# Patient Record
Sex: Female | Born: 1995 | Race: White | Hispanic: Yes | Marital: Married | Smoking: Never smoker
Health system: Southern US, Community
[De-identification: ages and names within clinical notes are randomized; demographics above are authoritative.]

## PROBLEM LIST (undated history)

## (undated) ENCOUNTER — Emergency Department (HOSPITAL_COMMUNITY): Discharge status: Absent without leave

## (undated) DIAGNOSIS — N946 Dysmenorrhea, unspecified: Secondary | ICD-10-CM

## (undated) DIAGNOSIS — F419 Anxiety disorder, unspecified: Secondary | ICD-10-CM

## (undated) DIAGNOSIS — G43109 Migraine with aura, not intractable, without status migrainosus: Secondary | ICD-10-CM

## (undated) HISTORY — DX: Dysmenorrhea, unspecified: N94.6

## (undated) HISTORY — DX: Anxiety disorder, unspecified: F41.9

## (undated) HISTORY — PX: HERNIA REPAIR: SHX51

## (undated) HISTORY — DX: Migraine with aura, not intractable, without status migrainosus: G43.109

---
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
## 2014-11-13 ENCOUNTER — Emergency Department (HOSPITAL_COMMUNITY)

## 2014-11-13 ENCOUNTER — Encounter (HOSPITAL_COMMUNITY): Payer: Self-pay | Admitting: Emergency Medicine

## 2014-11-13 ENCOUNTER — Emergency Department (HOSPITAL_COMMUNITY)
Admission: EM | Admit: 2014-11-13 | Discharge: 2014-11-13 | Disposition: A | Discharge status: Home / Self Care | Attending: Emergency Medicine | Admitting: Emergency Medicine

## 2014-11-13 DIAGNOSIS — K529 Noninfective gastroenteritis and colitis, unspecified: Secondary | ICD-10-CM

## 2014-11-13 DIAGNOSIS — Z3202 Encounter for pregnancy test, result negative: Secondary | ICD-10-CM | POA: Insufficient documentation

## 2014-11-13 DIAGNOSIS — Z79899 Other long term (current) drug therapy: Secondary | ICD-10-CM | POA: Diagnosis not present

## 2014-11-13 DIAGNOSIS — R1031 Right lower quadrant pain: Secondary | ICD-10-CM | POA: Diagnosis present

## 2014-11-13 DIAGNOSIS — R1084 Generalized abdominal pain: Secondary | ICD-10-CM

## 2014-11-13 LAB — CBC WITH DIFFERENTIAL/PLATELET
BASOS PCT: 0 %
Basophils Absolute: 0 10*3/uL (ref 0.0–0.1)
Eosinophils Absolute: 0 10*3/uL (ref 0.0–0.7)
Eosinophils Relative: 0 %
HEMATOCRIT: 34.4 % — AB (ref 36.0–46.0)
HEMOGLOBIN: 12 g/dL (ref 12.0–15.0)
LYMPHS ABS: 1 10*3/uL (ref 0.7–4.0)
Lymphocytes Relative: 10 %
MCH: 31.8 pg (ref 26.0–34.0)
MCHC: 34.9 g/dL (ref 30.0–36.0)
MCV: 91.2 fL (ref 78.0–100.0)
Monocytes Absolute: 0.5 10*3/uL (ref 0.1–1.0)
Monocytes Relative: 5 %
NEUTROS ABS: 8.3 10*3/uL — AB (ref 1.7–7.7)
NEUTROS PCT: 85 %
Platelets: 154 10*3/uL (ref 150–400)
RBC: 3.77 MIL/uL — ABNORMAL LOW (ref 3.87–5.11)
RDW: 11.9 % (ref 11.5–15.5)
WBC: 9.7 10*3/uL (ref 4.0–10.5)

## 2014-11-13 LAB — COMPREHENSIVE METABOLIC PANEL
ALT: 23 U/L (ref 14–54)
AST: 26 U/L (ref 15–41)
Albumin: 3.6 g/dL (ref 3.5–5.0)
Alkaline Phosphatase: 60 U/L (ref 38–126)
Anion gap: 6 (ref 5–15)
BUN: 7 mg/dL (ref 6–20)
CHLORIDE: 106 mmol/L (ref 101–111)
CO2: 23 mmol/L (ref 22–32)
Calcium: 8.2 mg/dL — ABNORMAL LOW (ref 8.9–10.3)
Creatinine, Ser: 0.81 mg/dL (ref 0.44–1.00)
GFR calc Af Amer: >60 mL/min (ref >60)
GFR calc non Af Amer: >60 mL/min (ref >60)
Glucose, Bld: 88 mg/dL (ref 65–99)
Potassium: 3.6 mmol/L (ref 3.5–5.1)
SODIUM: 135 mmol/L (ref 135–145)
Total Bilirubin: 0.5 mg/dL (ref 0.3–1.2)
Total Protein: 6.5 g/dL (ref 6.5–8.1)

## 2014-11-13 LAB — URINALYSIS, ROUTINE W REFLEX MICROSCOPIC
BILIRUBIN URINE: NEGATIVE
GLUCOSE, UA: NEGATIVE mg/dL
Ketones, ur: >80 mg/dL — AB
Leukocytes, UA: NEGATIVE
Nitrite: NEGATIVE
PH: 6 (ref 5.0–8.0)
Protein, ur: 30 mg/dL — AB
SPECIFIC GRAVITY, URINE: 1.025 (ref 1.005–1.030)
Urobilinogen, UA: 0.2 mg/dL (ref 0.0–1.0)

## 2014-11-13 LAB — I-STAT BETA HCG BLOOD, ED (MC, WL, AP ONLY): I-stat hCG, quantitative: <5 m[IU]/mL (ref <5)

## 2014-11-13 LAB — URINE MICROSCOPIC-ADD ON

## 2014-11-13 LAB — LIPASE, BLOOD: LIPASE: 18 U/L — AB (ref 22–51)

## 2014-11-13 MED ORDER — CIPROFLOXACIN HCL 500 MG PO TABS: 500.0000 mg | ORAL_TABLET | Freq: Two times a day (BID) | ORAL | Status: DC

## 2014-11-13 MED ORDER — ACETAMINOPHEN 500 MG PO TABS
1000.0000 mg | ORAL_TABLET | Freq: Once | ORAL | Status: AC
  Administered 2014-11-13: 1000 mg via ORAL
  Filled 2014-11-13: qty 2

## 2014-11-13 MED ORDER — METRONIDAZOLE 500 MG PO TABS: 500.0000 mg | ORAL_TABLET | Freq: Three times a day (TID) | ORAL | Status: AC

## 2014-11-13 MED ORDER — MORPHINE SULFATE (PF) 4 MG/ML IV SOLN: 4.0000 mg | Freq: Once | INTRAVENOUS | Status: DC

## 2014-11-13 MED ORDER — SODIUM CHLORIDE 0.9 % IV BOLUS (SEPSIS)
1000.0000 mL | INTRAVENOUS | Status: AC
  Administered 2014-11-13: 1000 mL via INTRAVENOUS

## 2014-11-13 MED ORDER — MORPHINE SULFATE (PF) 4 MG/ML IV SOLN
2.0000 mg | Freq: Once | INTRAVENOUS | Status: AC
  Administered 2014-11-13: 2 mg via INTRAVENOUS
  Filled 2014-11-13: qty 1

## 2014-11-13 MED ORDER — ONDANSETRON HCL 4 MG/2ML IJ SOLN
4.0000 mg | Freq: Once | INTRAMUSCULAR | Status: AC
  Administered 2014-11-13: 4 mg via INTRAVENOUS
  Filled 2014-11-13: qty 2

## 2014-11-13 MED ORDER — HYDROCODONE-ACETAMINOPHEN 5-325 MG PO TABS: 2.0000 | ORAL_TABLET | ORAL | Status: DC | PRN

## 2014-11-13 MED ORDER — METRONIDAZOLE 500 MG PO TABS
500.0000 mg | ORAL_TABLET | Freq: Once | ORAL | Status: AC
  Administered 2014-11-13: 500 mg via ORAL
  Filled 2014-11-13: qty 1

## 2014-11-13 MED ORDER — CIPROFLOXACIN HCL 500 MG PO TABS
500.0000 mg | ORAL_TABLET | Freq: Once | ORAL | Status: AC
  Administered 2014-11-13: 500 mg via ORAL
  Filled 2014-11-13: qty 1

## 2014-11-13 MED ORDER — ONDANSETRON HCL 4 MG PO TABS: 4.0000 mg | ORAL_TABLET | Freq: Three times a day (TID) | ORAL | Status: DC | PRN

## 2014-11-13 MED ORDER — MORPHINE SULFATE (PF) 4 MG/ML IV SOLN
2.0000 mg | Freq: Once | INTRAVENOUS | Status: DC
  Filled 2014-11-13: qty 1

## 2014-11-13 NOTE — ED Provider Notes (Signed)
CSN: [id number]     Arrival date & time 11/13/14  1016 History   First MD Initiated Contact with Patient 11/13/14 1110     Chief Complaint  Patient presents with  . right abdominal pain      (Consider location/radiation/quality/duration/timing/severity/associated sxs/prior Treatment) HPI     Patient is a 19 year old female with history of hernia repair, but otherwise healthy, presents to the emergency department today for 2 days of diarrhea, with worsening right lower quadrant pain, associated with nausea and vomiting. Patient states that she has a stabbing pain felt in the right lower quadrant which is worsened with movement or walking. It is been constant since its onset 2 days ago, and is not relieved by anything, currently rated 7/10.  She describes her diarrhea as frequent, watery dark brown and foul smelling.  She denies any sick contacts, denies melena, hematochezia, and hematemesis.  She tried pepto bismo today without any relief.  She further denies dysuria, hematuria, vaginal pain, vaginal discharge, possibility of STD, she does not believe that she is pregnant. She denies any worsening of her pain with eating, denies unintentional weightloss, GERD sx or dyspepsia.  She has not had a fever, chills, sweats, CP, SOB or syncope.  She has not hx of abdominal surgery. She has no known allergies, she last ate at 3 AM this morning.  She has no other complaints at this time.   History reviewed. No pertinent past medical history. No past surgical history on file. No family history on file. Social History  Substance Use Topics  . Smoking status: Never Smoker   . Smokeless tobacco: None  . Alcohol Use: No   OB History    No data available     Review of Systems  HENT: Negative.   Eyes: Negative.   Respiratory: Negative.   Cardiovascular: Negative.   Gastrointestinal: Negative for constipation, blood in stool, anal bleeding and rectal pain.  Endocrine: Negative.   Genitourinary:  Negative.   Musculoskeletal: Negative.   Neurological: Positive for weakness. Negative for dizziness, tremors, syncope, light-headedness, numbness and headaches.  Psychiatric/Behavioral: Negative.       Allergies  Review of patient's allergies indicates no known allergies.  Home Medications   Prior to Admission medications   Medication Sig Start Date End Date Taking? Authorizing Provider  Ascorbic Acid (VITAMIN C PO) Take 2 tablets by mouth daily.   Yes Historical Provider, MD  bismuth subsalicylate (PEPTO BISMOL) 262 MG/15ML suspension Take 30 mLs by mouth every 6 (six) hours as needed for indigestion.   Yes Historical Provider, MD  ibuprofen (ADVIL,MOTRIN) 200 MG tablet Take 800 mg by mouth every 6 (six) hours as needed for moderate pain.   Yes Historical Provider, MD  ciprofloxacin (CIPRO) 500 MG tablet Take 1 tablet (500 mg total) by mouth 2 (two) times daily. 11/13/14   Danelle Berry, PA-C  HYDROcodone-acetaminophen (NORCO/VICODIN) 5-325 MG per tablet Take 2 tablets by mouth every 4 (four) hours as needed. 11/13/14   Danelle Berry, PA-C  ibuprofen (ADVIL,MOTRIN) 800 MG tablet Take 1 tablet (800 mg total) by mouth every 8 (eight) hours as needed for moderate pain or cramping. Patient not taking: Reported on 11/13/2014 05/29/13   Kristen N Ward, DO  metroNIDAZOLE (FLAGYL) 500 MG tablet Take 1 tablet (500 mg total) by mouth 3 (three) times daily. 11/13/14 11/23/14  Danelle Berry, PA-C  ondansetron (ZOFRAN) 4 MG tablet Take 1 tablet (4 mg total) by mouth every 8 (eight) hours as needed for nausea or  vomiting. 11/13/14   Danelle Berry, PA-C   BP 112/66 mmHg  Pulse 113  Temp(Src) 99.6 F (37.6 C) (Oral)  Resp 18  Ht  (1.575 m)  Wt 95 lb (43.092 kg)  BMI 17.37 kg/m2  SpO2 98%  LMP 10/20/2014 Physical Exam  Constitutional: She is oriented to person, place, and time. She appears well-developed. No distress.  Well-developed, thin female, appears to be in pain  HENT:  Head: Normocephalic and  atraumatic.  Nose: Nose normal.  Mouth/Throat: Oropharynx is clear and moist. No oropharyngeal exudate.  Eyes: Conjunctivae and EOM are normal. Pupils are equal, round, and reactive to light. Right eye exhibits no discharge. Left eye exhibits no discharge. No scleral icterus.  Neck: Normal range of motion. No JVD present. No tracheal deviation present. No thyromegaly present.  Cardiovascular: Normal rate, regular rhythm, normal heart sounds and intact distal pulses.  Exam reveals no gallop and no friction rub.   No murmur heard. Pulmonary/Chest: Effort normal and breath sounds normal. No respiratory distress. She has no wheezes. She has no rales. She exhibits no tenderness.  Abdominal: Soft. Normal appearance and bowel sounds are normal. She exhibits no distension and no mass. There is tenderness in the right lower quadrant and suprapubic area. There is guarding, CVA tenderness and tenderness at McBurney's point. There is no rigidity, no rebound and negative Murphy's sign. No hernia.  Abdomen is soft, with normal bowel sounds 4, tender to palpation right lower quadrant, positive Rovsing's, positive heeltap test, CVA tenderness on the right, negative obturator, negative psoas   Genitourinary: Uterus is tender. Cervix exhibits no motion tenderness. Right adnexum displays tenderness. Right adnexum displays no mass and no fullness. Left adnexum displays tenderness. Left adnexum displays no mass and no fullness. There is bleeding in the vagina.  Musculoskeletal: Normal range of motion. She exhibits no edema or tenderness.  Lymphadenopathy:    She has no cervical adenopathy.  Neurological: She is alert and oriented to person, place, and time. She has normal reflexes. No cranial nerve deficit. She exhibits normal muscle tone. Coordination normal.  Skin: Skin is warm and dry. No rash noted. She is not diaphoretic. No erythema. No pallor.  Psychiatric: She has a normal mood and affect. Her behavior is  normal. Judgment and thought content normal.  Nursing note and vitals reviewed.   ED Course  Procedures (including critical care time) Labs Review Labs Reviewed  CBC WITH DIFFERENTIAL/PLATELET - Abnormal; Notable for the following:    RBC 3.77 (*)    HCT 34.4 (*)    Neutro Abs 8.3 (*)    All other components within normal limits  LIPASE, BLOOD - Abnormal; Notable for the following:    Lipase 18 (*)    All other components within normal limits  COMPREHENSIVE METABOLIC PANEL - Abnormal; Notable for the following:    Calcium 8.2 (*)    All other components within normal limits  URINALYSIS, ROUTINE W REFLEX MICROSCOPIC (NOT AT [facility]) - Abnormal; Notable for the following:    Hgb urine dipstick LARGE (*)    Ketones, ur >80 (*)    Protein, ur 30 (*)    All other components within normal limits  WET PREP, GENITAL  URINE MICROSCOPIC-ADD ON  I-STAT BETA HCG BLOOD, ED ([facility], [facility], [facility] ONLY)    Imaging Review No results found. I have personally reviewed and evaluated these images and lab results as part of my medical decision-making.   EKG Interpretation None  MDM   Final diagnoses:  Acute colitis  Generalized abdominal pain    Patient with right lower quadrant tenderness, very tender on exam, her temperature is 100, pulse 100, it has been associated with nausea, vomiting and diarrhea.  Ct abd/pelvis, CMP, CBC, UA Pt started her period while in the ED CT negative for acute appendicitis, obstruction, perf - CT shows colonic wall thickening, consistent with acute colitis -  Labs otherwise unremarkable, UA consistent with dehydration and decreased PO intake, no UTI Bimanual exam performed, no CMT, exam limited due to extreme abdominal tenderness, pt was very tense.  Pt pain was controlled while in the ED.  She has not had any V or D while here.  Pt will be given cipro/flagyl to treat colitis given her imaging results.  Pt discharged home after successful PO trial.  Rx for  zofran/norco.  Return precautions given.  Pt will follow up with her PCP at home or at college.        Danelle Berry, PA-C 11/19/14 0201  Derwood Kaplan, MD 11/24/14 0100

## 2014-11-13 NOTE — ED Notes (Signed)
Patient returned from CT

## 2014-11-13 NOTE — ED Notes (Signed)
Per pt, states right lower quadrant pain since Tuesday-[facility] here for possible appey-blood work done

## 2014-11-13 NOTE — Discharge Instructions (Signed)

## 2017-10-16 ENCOUNTER — Telehealth: Payer: Self-pay | Admitting: Obstetrics and Gynecology

## 2017-10-16 NOTE — Telephone Encounter (Signed)
Called and left a message for patient to call back to schedule a new patient doctor referral appointment with our office to see any provider for vulvar dermatitis.

## 2017-11-08 ENCOUNTER — Ambulatory Visit: Payer: BLUE CROSS/BLUE SHIELD | Admitting: Obstetrics and Gynecology

## 2017-11-29 ENCOUNTER — Ambulatory Visit: Payer: BLUE CROSS/BLUE SHIELD | Admitting: Obstetrics and Gynecology

## 2017-11-29 ENCOUNTER — Encounter: Payer: Self-pay | Admitting: Obstetrics and Gynecology

## 2017-11-29 ENCOUNTER — Other Ambulatory Visit: Payer: Self-pay

## 2017-11-29 VITALS — BP 108/62 | HR 68 | Ht 64.0 in | Wt 106.0 lb

## 2017-11-29 DIAGNOSIS — Z3009 Encounter for other general counseling and advice on contraception: Secondary | ICD-10-CM | POA: Diagnosis not present

## 2017-11-29 DIAGNOSIS — N763 Subacute and chronic vulvitis: Secondary | ICD-10-CM

## 2017-11-29 DIAGNOSIS — N946 Dysmenorrhea, unspecified: Secondary | ICD-10-CM | POA: Diagnosis not present

## 2017-11-29 MED ORDER — IBUPROFEN 800 MG PO TABS: 800.0000 mg | ORAL_TABLET | Freq: Three times a day (TID) | ORAL | 1 refills | Status: DC | PRN

## 2017-11-29 MED ORDER — NORETHIN ACE-ETH ESTRAD-FE 1-20 MG-MCG PO TABS: 1.0000 | ORAL_TABLET | Freq: Every day | ORAL | 3 refills | Status: DC

## 2017-11-29 MED ORDER — NORETHIN ACE-ETH ESTRAD-FE 1-20 MG-MCG PO TABS: 1.0000 | ORAL_TABLET | Freq: Every day | ORAL | 0 refills | Status: DC

## 2017-11-29 MED ORDER — BETAMETHASONE VALERATE 0.1 % EX OINT: TOPICAL_OINTMENT | CUTANEOUS | 0 refills | Status: DC

## 2017-11-29 NOTE — Progress Notes (Signed)
22 y.o. G0P0000 Single White or Caucasian Not Hispanic or Latino female here for a consultation from [facility] hypopigmentation of labia. Reports itching and irritation that began a few months ago.  Symptoms started 2 months ago. Itching can be severe at times. Not worse at night. The itching is on the inner folds of the labia on both sides. No abnormal vaginal discharge or odor.  She is sexually active, engaged.  Getting married 01/20/18.  Using condoms, but interested in contraception, OCP's. No dyspareunia.  Cycles are regular and light, but has severe cramps. If she starts the ibuprofen prior to the cramps it helps most of the time. Can have difficulty functioning with the cramps.   Period Cycle (Days): 28 Period Duration (Days): 4-5 days Period Pattern: Regular Menstrual Flow: Heavy Menstrual Control: Thin pad Menstrual Control Change Freq (Hours): changes pad every 6-8 hours Dysmenorrhea: (!) Severe Dysmenorrhea Symptoms: Cramping  Patient's last menstrual period was 11/04/2017 (exact date).          Sexually active: Yes.    The current method of family planning is condoms sometimes and most of the time.    Exercising: Yes.    personal trainer Smoker:  no  Health Maintenance: Pap:  August, 2019 [facility] History of abnormal Pap:  no TDaP:  Up to date Gardasil: completed all 3   reports that she has never smoked. She has never used smokeless tobacco. She reports that she drinks about 2.0 - 3.0 standard drinks of alcohol per week. She reports that she does not use drugs. She is getting her masters in Biology from [employer]. Ultimately she wants to get her PhD in [place]. Her fiance works in Consulting civil engineer.    Past Medical History:  Diagnosis Date  . Anxiety   . Dysmenorrhea     Past Surgical History:  Procedure Laterality Date  . HERNIA REPAIR      Current Outpatient Medications  Medication Sig Dispense Refill  . Ascorbic Acid (VITAMIN C PO) Take 2 tablets by  mouth daily.    Marland Kitchen bismuth subsalicylate (PEPTO BISMOL) 262 MG/15ML suspension Take 30 mLs by mouth every 6 (six) hours as needed for indigestion.    Marland Kitchen ibuprofen (ADVIL,MOTRIN) 200 MG tablet Take 800 mg by mouth every 6 (six) hours as needed for moderate pain.    Marland Kitchen ibuprofen (ADVIL,MOTRIN) 800 MG tablet Take 1 tablet (800 mg total) by mouth every 8 (eight) hours as needed for moderate pain or cramping. 30 tablet 0   No current facility-administered medications for this visit.     Family History  Problem Relation Age of Onset  . Fibroids Mother     Review of Systems  Constitutional: Negative.   HENT: Negative.   Eyes: Negative.   Respiratory: Negative.   Cardiovascular: Negative.   Gastrointestinal: Negative.   Endocrine: Negative.   Genitourinary:       Labial pigment changes Labia itching and irritation  Musculoskeletal: Negative.   Skin: Negative.   Allergic/Immunologic: Negative.   Neurological: Negative.   Hematological: Negative.   Psychiatric/Behavioral: Negative.     Exam:   BP 108/62 (BP Location: Right Arm, Patient Position: Sitting, Cuff Size: Normal)   Pulse 68   Ht 5\' 4"  (1.626 m)   Wt 106 lb (48.1 kg)   LMP 11/04/2017 (Exact Date)   BMI 18.19 kg/m   Weight change: @WEIGHTCHANGE @ Height:   Height: 5\' 4"  (162.6 cm)  Ht Readings from Last 3 Encounters:  11/29/17 5\' 4"  (  1.626 m)  11/13/14 5\' 2"  (1.575 m)    General appearance: alert, cooperative and appears stated age   Pelvic: External genitalia:  Slight whitening on the upper inner labia minora, small fissure. No agglutination. Few small white spots on the vulva.              Urethra:  normal appearing urethra with no masses, tenderness or lesions              Bartholins and Skenes: normal                 Vagina: normal appearing vagina with normal color and discharge, no lesions              Cervix: no cervical motion tenderness and no lesions               Bimanual Exam:  Uterus:  normal size,  contour, position, consistency, mobility, non-tender and retroverted              Adnexa: no mass, fullness, tenderness               Rectovaginal: deferred  Chaperone was present for exam.  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

## 2017-11-29 NOTE — Patient Instructions (Signed)
Oral Contraception Information Oral contraceptive pills (OCPs) are medicines taken to prevent pregnancy. OCPs work by preventing the ovaries from releasing eggs. The hormones in OCPs also cause the cervical mucus to thicken, preventing the sperm from entering the uterus. The hormones also cause the uterine lining to become thin, not allowing a fertilized egg to attach to the inside of the uterus. OCPs are highly effective when taken exactly as prescribed. However, OCPs do not prevent sexually transmitted diseases (STDs). Safe sex practices, such as using condoms along with the pill, can help prevent STDs. Before taking the pill, you may have a physical exam and Pap test. Your health care provider may order blood tests. The health care provider will make sure you are a good candidate for oral contraception. Discuss with your health care provider the possible side effects of the OCP you may be prescribed. When starting an OCP, it can take 2 to 3 months for the body to adjust to the changes in hormone levels in your body. Types of oral contraception  The combination pill-This pill contains estrogen and progestin (synthetic progesterone) hormones. The combination pill comes in 21-day, 28-day, or 91-day packs. Some types of combination pills are meant to be taken continuously (365-day pills). With 21-day packs, you do not take pills for 7 days after the last pill. With 28-day packs, the pill is taken every day. The last 7 pills are without hormones. Certain types of pills have more than 21 hormone-containing pills. With 91-day packs, the first 84 pills contain both hormones, and the last 7 pills contain no hormones or contain estrogen only.  The minipill-This pill contains the progesterone hormone only. The pill is taken every day continuously. It is very important to take the pill at the same time each day. The minipill comes in packs of 28 pills. All 28 pills contain the hormone. Advantages of oral  contraceptive pills  Decreases premenstrual symptoms.  Treats menstrual period cramps.  Regulates the menstrual cycle.  Decreases a heavy menstrual flow.  May treatacne, depending on the type of pill.  Treats abnormal uterine bleeding.  Treats polycystic ovarian syndrome.  Treats endometriosis.  Can be used as emergency contraception. Things that can make oral contraceptive pills less effective OCPs can be less effective if:  You forget to take the pill at the same time every day.  You have a stomach or intestinal disease that lessens the absorption of the pill.  You take OCPs with other medicines that make OCPs less effective, such as antibiotics, certain HIV medicines, and some seizure medicines.  You take expired OCPs.  You forget to restart the pill on day 7, when using the packs of 21 pills.  Risks associated with oral contraceptive pills Oral contraceptive pills can sometimes cause side effects, such as:  Headache.  Nausea.  Breast tenderness.  Irregular bleeding or spotting.  Combination pills are also associated with a small increased risk of:  Blood clots.  Heart attack.  Stroke.  This information is not intended to replace advice given to you by your health care provider. Make sure you discuss any questions you have with your health care provider. Document Released: 04/30/2002 Document Revised: 07/16/2015 Document Reviewed: 07/29/2012 Elsevier Interactive Patient Education  2018 Elsevier Inc.  

## 2017-11-30 ENCOUNTER — Telehealth: Payer: Self-pay

## 2017-11-30 LAB — VAGINITIS/VAGINOSIS, DNA PROBE
CANDIDA SPECIES: NEGATIVE
Gardnerella vaginalis: NEGATIVE
Trichomonas vaginosis: NEGATIVE

## 2017-11-30 NOTE — Telephone Encounter (Signed)
-----   Message from Romualdo Bolk, MD sent at 11/30/2017 11:07 AM EDT ----- Please inform the patient that her vaginitis panel is negative. She should get better with the steroid ointment. If not she should call.

## 2017-11-30 NOTE — Telephone Encounter (Signed)
Spoke with patient. Results given. Patient verbalizes understanding. Encounter closed. 

## 2018-02-27 NOTE — Progress Notes (Signed)
GYNECOLOGY  VISIT   HPI: 23 y.o.   Single White or Caucasian Not Hispanic or Latino  female   G0P0000 with Patient's last menstrual period was 02/09/2018 (exact date).   here for 3 month follow up on OCP. Prior to OCP's she was having severe dysmenorrhea. The first 2 months were fine. Her cycles were monthly x 3-4 days, lighter. She changes her pad every 6 hours.  Cramps haven't changed. She still needed the Ibuprofen, it helps, but doesn't take the pain away. She can function with the ibuprofen. The last pack of the pill she had some spotting, took one pill 3 hours late.    She took a UPT on 12/30 and last Monday, both were negative.    GYNECOLOGIC HISTORY: Patient's last menstrual period was 02/09/2018 (exact date). Contraception: OCP Menopausal hormone therapy: None   OB History    Gravida  0   Para  0   Term  0   Preterm  0   AB  0   Living  0     SAB  0   TAB  0   Ectopic  0   Multiple  0   Live Births  0              There are no active problems to display for this patient.   Past Medical History:  Diagnosis Date  . Anxiety   . Dysmenorrhea     Past Surgical History:  Procedure Laterality Date  . HERNIA REPAIR      Current Outpatient Medications  Medication Sig Dispense Refill  . Ascorbic Acid (VITAMIN C PO) Take 2 tablets by mouth daily.    . betamethasone valerate ointment (VALISONE) 0.1 % Apply a pea sized amount topically bid for 1-2 weeks as needed. Not for daily long term use. 15 g 0  . bismuth subsalicylate (PEPTO BISMOL) 262 MG/15ML suspension Take 30 mLs by mouth every 6 (six) hours as needed for indigestion.    Marland Kitchen ibuprofen (ADVIL,MOTRIN) 800 MG tablet Take 1 tablet (800 mg total) by mouth every 8 (eight) hours as needed. 30 tablet 1  . norethindrone-ethinyl estradiol (JUNEL FE,GILDESS FE,LOESTRIN FE) 1-20 MG-MCG tablet Take 1 tablet by mouth daily. 3 Package 0   No current facility-administered medications for this visit.       ALLERGIES: Patient has no known allergies.  Family History  Problem Relation Age of Onset  . Fibroids Mother     Social History   Socioeconomic History  . Marital status: Single    Spouse name: Not on file  . Number of children: Not on file  . Years of education: Not on file  . Highest education level: Not on file  Occupational History  . Not on file  Social Needs  . Financial resource strain: Not on file  . Food insecurity:    Worry: Not on file    Inability: Not on file  . Transportation needs:    Medical: Not on file    Non-medical: Not on file  Tobacco Use  . Smoking status: Never Smoker  . Smokeless tobacco: Never Used  Substance and Sexual Activity  . Alcohol use: Yes    Alcohol/week: 2.0 - 3.0 standard drinks    Types: 2 - 3 Standard drinks or equivalent per week  . Drug use: No  . Sexual activity: Yes    Birth control/protection: Pill  Lifestyle  . Physical activity:    Days per week: Not on file  Minutes per session: Not on file  . Stress: Not on file  Relationships  . Social connections:    Talks on phone: Not on file    Gets together: Not on file    Attends religious service: Not on file    Active member of club or organization: Not on file    Attends meetings of clubs or organizations: Not on file    Relationship status: Not on file  . Intimate partner violence:    Fear of current or ex partner: Not on file    Emotionally abused: Not on file    Physically abused: Not on file    Forced sexual activity: Not on file  Other Topics Concern  . Not on file  Social History Narrative  . Not on file    Review of Systems  Constitutional: Negative.   HENT: Negative.   Eyes: Negative.   Respiratory: Negative.   Cardiovascular: Negative.   Gastrointestinal: Negative.   Genitourinary:       Excess bleeding Painful periods Unscheduled bleeding  Musculoskeletal: Negative.   Skin: Negative.   Neurological: Negative.   Endo/Heme/Allergies:  Negative.   Psychiatric/Behavioral: Negative.     PHYSICAL EXAMINATION:    BP 120/70 (BP Location: Right Arm, Patient Position: Sitting, Cuff Size: Normal)   Pulse 60   Wt 107 lb 9.6 oz (48.8 kg)   LMP 02/09/2018 (Exact Date)   BMI 18.47 kg/m     General appearance: alert, cooperative and appears stated age  ASSESSMENT BTB on OCP's, negative UPT at home x 2 Severe dysmenorrhea    PLAN Change to loseasonique, discussed possible BTB Ibuprofen for cramps F/U in August for an annual exam   An After Visit Summary was printed and given to the patient.  ~15 minutes face to face time of which over 50% was spent in counseling.

## 2018-03-01 ENCOUNTER — Other Ambulatory Visit: Payer: Self-pay

## 2018-03-01 ENCOUNTER — Encounter: Payer: Self-pay | Admitting: Obstetrics and Gynecology

## 2018-03-01 ENCOUNTER — Ambulatory Visit (INDEPENDENT_AMBULATORY_CARE_PROVIDER_SITE_OTHER): Payer: Managed Care, Other (non HMO) | Admitting: Obstetrics and Gynecology

## 2018-03-01 VITALS — BP 120/70 | HR 60 | Wt 107.6 lb

## 2018-03-01 DIAGNOSIS — N921 Excessive and frequent menstruation with irregular cycle: Secondary | ICD-10-CM | POA: Diagnosis not present

## 2018-03-01 DIAGNOSIS — N946 Dysmenorrhea, unspecified: Secondary | ICD-10-CM | POA: Diagnosis not present

## 2018-03-01 DIAGNOSIS — Z3041 Encounter for surveillance of contraceptive pills: Secondary | ICD-10-CM

## 2018-03-01 MED ORDER — IBUPROFEN 800 MG PO TABS: 800.0000 mg | ORAL_TABLET | Freq: Three times a day (TID) | ORAL | 1 refills | Status: DC | PRN

## 2018-03-01 MED ORDER — LEVONORGEST-ETH ESTRAD 91-DAY 0.1-0.02 & 0.01 MG PO TABS: 1.0000 | ORAL_TABLET | Freq: Every day | ORAL | 2 refills | Status: DC

## 2018-05-09 ENCOUNTER — Telehealth: Payer: Self-pay | Admitting: Obstetrics and Gynecology

## 2018-05-09 NOTE — Telephone Encounter (Signed)
Spoke with patient. Changed from OCP [facility] to [facility] 03/01/18. No missed or late pills. SA, took UPT 2 wks ago and again 3/17, both negative. Reports spotting the first wk of March then full cycle the second wk of March. Mild menses cramps and lower back pain. Denies any other GYN symptoms, urinary symptoms, fever/chills, N/V. Is in 1st wk of 3rd month of pills.   Advised patient BTB not uncommon with OCP change, can take 3 months for cycles to adjust. Recommended patient continue OCP, continue to monitor cycles. If BTB continues or new symptoms develop, return call to office to schedule OV. Advised Dr. Oscar La will review, I will return call if any additional recommendations. Patient agreeable.   Routing to provider for final review. Patient is agreeable to disposition. Will close encounter.

## 2018-05-09 NOTE — Telephone Encounter (Signed)
Patient called states her birth control was changed to a different pill and she is now experiencing breakthrough bleeding. Patient is asking is this is normal when pills have been changed  Routing to Triage Nurse

## 2018-05-17 ENCOUNTER — Encounter (HOSPITAL_COMMUNITY): Payer: Self-pay | Admitting: *Deleted

## 2018-05-17 ENCOUNTER — Other Ambulatory Visit: Payer: Self-pay

## 2018-05-17 ENCOUNTER — Emergency Department (HOSPITAL_COMMUNITY): Payer: Managed Care, Other (non HMO)

## 2018-05-17 ENCOUNTER — Emergency Department (HOSPITAL_COMMUNITY)
Admission: EM | Admit: 2018-05-17 | Discharge: 2018-05-17 | Disposition: A | Discharge status: Home / Self Care | Payer: Managed Care, Other (non HMO) | Attending: Emergency Medicine | Admitting: Emergency Medicine

## 2018-05-17 DIAGNOSIS — R1031 Right lower quadrant pain: Secondary | ICD-10-CM | POA: Diagnosis present

## 2018-05-17 DIAGNOSIS — R102 Pelvic and perineal pain: Secondary | ICD-10-CM | POA: Diagnosis not present

## 2018-05-17 DIAGNOSIS — R110 Nausea: Secondary | ICD-10-CM | POA: Diagnosis not present

## 2018-05-17 DIAGNOSIS — Z79899 Other long term (current) drug therapy: Secondary | ICD-10-CM | POA: Insufficient documentation

## 2018-05-17 LAB — URINALYSIS, ROUTINE W REFLEX MICROSCOPIC
Bilirubin Urine: NEGATIVE
Glucose, UA: NEGATIVE mg/dL
Hgb urine dipstick: NEGATIVE
KETONES UR: NEGATIVE mg/dL
LEUKOCYTE UA: NEGATIVE
NITRITE: NEGATIVE
PH: 6 (ref 5.0–8.0)
PROTEIN: NEGATIVE mg/dL
Specific Gravity, Urine: 1.019 (ref 1.005–1.030)

## 2018-05-17 LAB — I-STAT BETA HCG BLOOD, ED (MC, WL, AP ONLY): I-stat hCG, quantitative: <5 m[IU]/mL (ref <5)

## 2018-05-17 LAB — CBC WITH DIFFERENTIAL/PLATELET
ABS IMMATURE GRANULOCYTES: 0.02 10*3/uL (ref 0.00–0.07)
BASOS ABS: 0 10*3/uL (ref 0.0–0.1)
BASOS PCT: 1 %
EOS PCT: 3 %
Eosinophils Absolute: 0.2 10*3/uL (ref 0.0–0.5)
HEMATOCRIT: 41.6 % (ref 36.0–46.0)
HEMOGLOBIN: 14 g/dL (ref 12.0–15.0)
Immature Granulocytes: 0 %
Lymphocytes Relative: 40 %
Lymphs Abs: 3.6 10*3/uL (ref 0.7–4.0)
MCH: 30.8 pg (ref 26.0–34.0)
MCHC: 33.7 g/dL (ref 30.0–36.0)
MCV: 91.6 fL (ref 80.0–100.0)
MONO ABS: 0.6 10*3/uL (ref 0.1–1.0)
Monocytes Relative: 7 %
NRBC: 0 % (ref 0.0–0.2)
Neutro Abs: 4.4 10*3/uL (ref 1.7–7.7)
Neutrophils Relative %: 49 %
Platelets: 268 10*3/uL (ref 150–400)
RBC: 4.54 MIL/uL (ref 3.87–5.11)
RDW: 10.9 % — ABNORMAL LOW (ref 11.5–15.5)
WBC: 8.8 10*3/uL (ref 4.0–10.5)

## 2018-05-17 LAB — COMPREHENSIVE METABOLIC PANEL
ALBUMIN: 3.9 g/dL (ref 3.5–5.0)
ALK PHOS: 48 U/L (ref 38–126)
ALT: 16 U/L (ref 0–44)
ANION GAP: 8 (ref 5–15)
AST: 24 U/L (ref 15–41)
BILIRUBIN TOTAL: 0.4 mg/dL (ref 0.3–1.2)
BUN: 11 mg/dL (ref 6–20)
CALCIUM: 9.1 mg/dL (ref 8.9–10.3)
CO2: 24 mmol/L (ref 22–32)
Chloride: 104 mmol/L (ref 98–111)
Creatinine, Ser: 0.93 mg/dL (ref 0.44–1.00)
GFR calc Af Amer: >60 mL/min (ref >60)
GFR calc non Af Amer: >60 mL/min (ref >60)
GLUCOSE: 103 mg/dL — AB (ref 70–99)
Potassium: 3.5 mmol/L (ref 3.5–5.1)
Sodium: 136 mmol/L (ref 135–145)
TOTAL PROTEIN: 7.5 g/dL (ref 6.5–8.1)

## 2018-05-17 LAB — LIPASE, BLOOD: Lipase: 26 U/L (ref 11–51)

## 2018-05-17 MED ORDER — ONDANSETRON HCL 4 MG PO TABS: 4.0000 mg | ORAL_TABLET | Freq: Four times a day (QID) | ORAL | 0 refills | Status: DC | PRN

## 2018-05-17 MED ORDER — SODIUM CHLORIDE 0.9 % IV BOLUS
1000.0000 mL | Freq: Once | INTRAVENOUS | Status: AC
  Administered 2018-05-17: 1000 mL via INTRAVENOUS

## 2018-05-17 MED ORDER — IOHEXOL 300 MG/ML  SOLN
100.0000 mL | Freq: Once | INTRAMUSCULAR | Status: AC | PRN
  Administered 2018-05-17: 100 mL via INTRAVENOUS

## 2018-05-17 MED ORDER — ONDANSETRON HCL 4 MG/2ML IJ SOLN
4.0000 mg | Freq: Once | INTRAMUSCULAR | Status: AC
  Administered 2018-05-17: 4 mg via INTRAVENOUS
  Filled 2018-05-17: qty 2

## 2018-05-17 MED ORDER — MORPHINE SULFATE (PF) 4 MG/ML IV SOLN
4.0000 mg | Freq: Once | INTRAVENOUS | Status: AC
  Administered 2018-05-17: 4 mg via INTRAVENOUS
  Filled 2018-05-17: qty 1

## 2018-05-17 MED ORDER — PANTOPRAZOLE SODIUM 40 MG PO TBEC: 40.0000 mg | DELAYED_RELEASE_TABLET | Freq: Every day | ORAL | 0 refills | Status: DC

## 2018-05-17 MED ORDER — SODIUM CHLORIDE 0.9% FLUSH
3.0000 mL | Freq: Once | INTRAVENOUS | Status: AC
  Administered 2018-05-17: 3 mL via INTRAVENOUS
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
## 2018-05-17 NOTE — ED Triage Notes (Addendum)
Pt c/o pain just to the right of the navel, started around 1600 Tuesday, associated with nausea and constipation. No fevers.

## 2018-05-17 NOTE — ED Notes (Signed)
Patient transported to CT 

## 2018-05-17 NOTE — ED Notes (Signed)
Patient aware that we need urine sample for testing, unable at this time. Pt given instruction on providing urine sample when able to do so.   

## 2018-05-17 NOTE — ED Notes (Signed)
Reviewed d/c instructions with pt, who verbalized understanding and had no outstanding questions. [employer] & pt labels removed and placed in shred bin. Pt departed in NAD, refused use of wheelchair.   

## 2018-05-17 NOTE — ED Notes (Signed)
ED Provider at bedside. 

## 2018-05-17 NOTE — Discharge Instructions (Addendum)
Return if pain is getting worse. °

## 2018-07-06 ENCOUNTER — Telehealth: Payer: Self-pay | Admitting: Obstetrics and Gynecology

## 2018-07-06 NOTE — Telephone Encounter (Signed)
Patient is calling with a few concerns regarding her birth control.

## 2018-07-06 NOTE — Telephone Encounter (Signed)
Spoke with patient. Patient is taking Loseasonique. Changed from [place] to [place] in 02/2018. Patient states the first week in March she had BTB. Then had her menses 05/29/2016 at the end of her 3rd pack as expected. On 06/28/2018 she began having BTB again with intermittent severe cramping. Has not missed any pills or taken any pills late. Took UPT that was negative. Patient has been having headaches every other day over the last few weeks-months. States she has had 2 episodes of "migraines with auras." Reports her vision gets blurry and then shortly after she gets a migraine. Not relieved with Ibuprofen or Excedrin migraine. Patient denies any history of migraines with auras. Advised not safe to take OCP if she is having migraines with auras. Advised not to take pill at this time. Needs to discontinue OCP. Will speak with provider and return call.

## 2018-07-06 NOTE — Telephone Encounter (Signed)
I recommend she stop the combined oral contraceptives, take ibuprofen 800 mg po q 8 hours for the next 1 - 2 days for dysmenorrhea prophylaxis, and return for an appointment with Dr. Oscar La next week to discuss progesterone options for contraception and determination if referral is needed to a neurologist.   Cc- Dr. Oscar La

## 2018-07-06 NOTE — Telephone Encounter (Signed)
Spoke with patient. Advised of message as seen below from Dr.Silva. Patient verbalizes understanding. OV scheduled for 07/10/2018 at 10 am with Dr.Jertson. Patient is agreeable to date and time.  Cc: Dr.Jertson  Routing to provider and will close encounter.

## 2018-07-09 NOTE — Progress Notes (Signed)
GYNECOLOGY  VISIT   HPI: 23 y.o.   Married White or Caucasian Not Hispanic or Latino  female   [id number] with Patient's last menstrual period was 06/28/2018 (exact date).   here for consult regarding birth control. Patient was taking Loseasonique. Changed from Jun[facility] to [facility] in 02/2018 secondary to severe dysmenorrhea even on the pill. Patient states the first week in March she had BTB. Then had her menses 05/29/2016 at the end of her 3rd pack as expected. On 06/28/2018 she began having BTB again with intermittent severe cramping. Has not missed any pills or taken any pills late. Took UPT that was negative. Patient has been having headaches every other day over the last few weeks-months. States she has had 2 episodes of "migraines with auras." Reports her vision gets blurry and then shortly after she gets a migraine. Not relieved with Ibuprofen or Excedrin migraine. Patient denies any history of migraines with auras. Advised not safe to take OCP if she is having migraines with auras. Patient was advised to stop OCP last week. Patient reports having intermittent pelvic pain that is sharp. She is married, in grad school. Doesn't want to have children for another 2-3 years.  She finished her first 3 month pack, was on the second month of her second pack when we asked her to stop secondary to the onset of migraine with auras. She was having BTB at the time, stopped the pill on Friday, continues to bleed. Started to get heavy on Sunday, changing a pad every 3 hours. Cramps are horrible, similar to her normal cramps. 800 mg of ibuprofen takes the edge off, heating pad helps.  She also c/o a several month h/o intermittent sharp pelvic pain. The pain lasts a couple of seconds at a time, occurring at least one x a day, can be multiple x a day. Up to a 9/10 in severity. Sexually active, no pain. Normal BM every day, no bladder symptoms.   GYNECOLOGIC HISTORY: Patient's last menstrual period was 06/28/2018 (exact  date). Contraception:None Menopausal hormone therapy: Non        OB History    Gravida  0   Para  0   Term  0   Preterm  0   AB  0   Living  0     SAB  0   TAB  0   Ectopic  0   Multiple  0   Live Births  0              There are no active problems to display for this patient.   Past Medical History:  Diagnosis Date  . Anxiety   . Dysmenorrhea     Past Surgical History:  Procedure Laterality Date  . HERNIA REPAIR      Current Outpatient Medications  Medication Sig Dispense Refill  . Ascorbic Acid (VITAMIN C PO) Take 2 tablets by mouth daily.    . betamethasone valerate ointment (VALISONE) 0.1 % Apply a pea sized amount topically bid for 1-2 weeks as needed. Not for daily long term use. 15 g 0  . bismuth subsalicylate (PEPTO BISMOL) 262 MG/15ML suspension Take 30 mLs by mouth every 6 (six) hours as needed for indigestion.    Marland Kitchen ibuprofen (ADVIL,MOTRIN) 800 MG tablet Take 1 tablet (800 mg total) by mouth every 8 (eight) hours as needed. 30 tablet 1  . ondansetron (ZOFRAN) 4 MG tablet Take 1 tablet (4 mg total) by mouth every 6 (six) hours as needed for  nausea or vomiting. 12 tablet 0  . Levonorgestrel-Ethinyl Estradiol (LOSEASONIQUE) 0.1-0.02 & 0.01 MG tablet Take 1 tablet by mouth daily. (Patient not taking: Reported on 07/10/2018) 1 Package 2   No current facility-administered medications for this visit.      ALLERGIES: Patient has no known allergies.  Family History  Problem Relation Age of Onset  . Fibroids Mother     Social History   Socioeconomic History  . Marital status: Married    Spouse name: Not on file  . Number of children: Not on file  . Years of education: Not on file  . Highest education level: Not on file  Occupational History  . Not on file  Social Needs  . Financial resource strain: Not on file  . Food insecurity:    Worry: Not on file    Inability: Not on file  . Transportation needs:    Medical: Not on file     Non-medical: Not on file  Tobacco Use  . Smoking status: Never Smoker  . Smokeless tobacco: Never Used  Substance and Sexual Activity  . Alcohol use: Yes    Alcohol/week: 2.0 - 3.0 standard drinks    Types: 2 - 3 Standard drinks or equivalent per week  . Drug use: No  . Sexual activity: Yes    Birth control/protection: None  Lifestyle  . Physical activity:    Days per week: Not on file    Minutes per session: Not on file  . Stress: Not on file  Relationships  . Social connections:    Talks on phone: Not on file    Gets together: Not on file    Attends religious service: Not on file    Active member of club or organization: Not on file    Attends meetings of clubs or organizations: Not on file    Relationship status: Not on file  . Intimate partner violence:    Fear of current or ex partner: Not on file    Emotionally abused: Not on file    Physically abused: Not on file    Forced sexual activity: Not on file  Other Topics Concern  . Not on file  Social History Narrative  . Not on file    Review of Systems  Constitutional: Negative.   HENT: Negative.   Eyes: Negative.   Respiratory: Negative.   Cardiovascular: Negative.   Gastrointestinal: Negative.   Genitourinary:       Pelvic pain  Musculoskeletal: Negative.   Skin: Negative.   Neurological: Positive for headaches.       Migraines with aura with OCP  Endo/Heme/Allergies: Negative.   Psychiatric/Behavioral: Negative.     PHYSICAL EXAMINATION:    BP 124/72 (BP Location: Right Arm, Patient Position: Sitting, Cuff Size: Normal)   Pulse 62   Temp 97.6 F (36.4 C) (Skin)   Wt 116 lb 12.8 oz (53 kg)   LMP 06/28/2018 (Exact Date)   BMI 21.36 kg/m     General appearance: alert, cooperative and appears stated age Abdomen: soft, tender in the RLQ, no rebound, no guarding; non distended, no masses,  no organomegaly  Pelvic: External genitalia:  no lesions              Urethra:  normal appearing urethra with no  masses, tenderness or lesions              Bartholins and Skenes: normal  Vagina: normal appearing vagina with normal color and discharge, no lesions              Cervix: no cervical motion tenderness and no lesions              Bimanual Exam:  Uterus:  normal size, contour, position, consistency, mobility, non-tender and retroverted              Adnexa: no mass, fullness, tenderness               Chaperone was present for exam.  ASSESSMENT Migraine with aura (new), stopped OCP's Needs contraception H/O severe dysmenorrhea Pelvic pain, intermittent for the last few months.     PLAN Genprobe Discussed option of condoms, mini-pill, nexplanon, depo-provera, and the mirena IUD Information on the IUD given Will use condoms for now Return for GYN ultrasound   An After Visit Summary was printed and given to the patient.  ~25 minutes face to face time of which over 50% was spent in counseling.

## 2018-07-10 ENCOUNTER — Ambulatory Visit (INDEPENDENT_AMBULATORY_CARE_PROVIDER_SITE_OTHER): Payer: Managed Care, Other (non HMO) | Admitting: Obstetrics and Gynecology

## 2018-07-10 ENCOUNTER — Other Ambulatory Visit: Payer: Self-pay

## 2018-07-10 ENCOUNTER — Encounter: Payer: Self-pay | Admitting: Obstetrics and Gynecology

## 2018-07-10 VITALS — BP 124/72 | HR 62 | Temp 97.6°F | Wt 116.8 lb

## 2018-07-10 DIAGNOSIS — Z3009 Encounter for other general counseling and advice on contraception: Secondary | ICD-10-CM | POA: Diagnosis not present

## 2018-07-10 DIAGNOSIS — G43109 Migraine with aura, not intractable, without status migrainosus: Secondary | ICD-10-CM | POA: Diagnosis not present

## 2018-07-10 DIAGNOSIS — R102 Pelvic and perineal pain: Secondary | ICD-10-CM

## 2018-07-10 DIAGNOSIS — N946 Dysmenorrhea, unspecified: Secondary | ICD-10-CM | POA: Diagnosis not present

## 2018-07-14 LAB — GC/CHLAMYDIA PROBE AMP
Chlamydia trachomatis, NAA: NEGATIVE
Neisseria Gonorrhoeae by PCR: NEGATIVE

## 2018-07-23 NOTE — Progress Notes (Signed)
GYNECOLOGY  VISIT   HPI: 23 y.o.   Married White or Caucasian Not Hispanic or Latino  female   G0P0000 with Patient's last menstrual period was 06/28/2018 (exact date).   here for consult following PUS done for pelvic pain. Her pain is currently better, she is having some mild lower back pain and occasional twinge of pelvic pain. She feels better off of OCP's. She went off on 07/06/18 secondary to new onset migraines with auras, was also having breakthrough bleeding.  GYNECOLOGIC HISTORY: Patient's last menstrual period was 06/28/2018 (exact date). Contraception:Condoms Menopausal hormone therapy: None        OB History    Gravida  0   Para  0   Term  0   Preterm  0   AB  0   Living  0     SAB  0   TAB  0   Ectopic  0   Multiple  0   Live Births  0              Patient Active Problem List   Diagnosis Date Noted  . Migraine with aura and without status migrainosus, not intractable 07/10/2018    Past Medical History:  Diagnosis Date  . Anxiety   . Dysmenorrhea   . Migraine with aura     Past Surgical History:  Procedure Laterality Date  . HERNIA REPAIR      Current Outpatient Medications  Medication Sig Dispense Refill  . Ascorbic Acid (VITAMIN C PO) Take 2 tablets by mouth daily.    . betamethasone valerate ointment (VALISONE) 0.1 % Apply a pea sized amount topically bid for 1-2 weeks as needed. Not for daily long term use. 15 g 0  . bismuth subsalicylate (PEPTO BISMOL) 262 MG/15ML suspension Take 30 mLs by mouth every 6 (six) hours as needed for indigestion.    Marland Kitchen ibuprofen (ADVIL,MOTRIN) 800 MG tablet Take 1 tablet (800 mg total) by mouth every 8 (eight) hours as needed. 30 tablet 1  . ondansetron (ZOFRAN) 4 MG tablet Take 1 tablet (4 mg total) by mouth every 6 (six) hours as needed for nausea or vomiting. 12 tablet 0   No current facility-administered medications for this visit.      ALLERGIES: Patient has no known allergies.  Family History   Problem Relation Age of Onset  . Fibroids Mother     Social History   Socioeconomic History  . Marital status: Married    Spouse name: Not on file  . Number of children: Not on file  . Years of education: Not on file  . Highest education level: Not on file  Occupational History  . Not on file  Social Needs  . Financial resource strain: Not on file  . Food insecurity:    Worry: Not on file    Inability: Not on file  . Transportation needs:    Medical: Not on file    Non-medical: Not on file  Tobacco Use  . Smoking status: Never Smoker  . Smokeless tobacco: Never Used  Substance and Sexual Activity  . Alcohol use: Yes    Alcohol/week: 4.0 - 5.0 standard drinks    Types: 4 - 5 Standard drinks or equivalent per week  . Drug use: No  . Sexual activity: Yes    Birth control/protection: Condom  Lifestyle  . Physical activity:    Days per week: Not on file    Minutes per session: Not on file  . Stress: Not  on file  Relationships  . Social connections:    Talks on phone: Not on file    Gets together: Not on file    Attends religious service: Not on file    Active member of club or organization: Not on file    Attends meetings of clubs or organizations: Not on file    Relationship status: Not on file  . Intimate partner violence:    Fear of current or ex partner: Not on file    Emotionally abused: Not on file    Physically abused: Not on file    Forced sexual activity: Not on file  Other Topics Concern  . Not on file  Social History Narrative  . Not on file    Review of Systems  Constitutional: Negative.   HENT: Negative.   Eyes: Negative.   Respiratory: Negative.   Cardiovascular: Negative.   Gastrointestinal: Negative.   Genitourinary: Negative.   Musculoskeletal: Negative.   Skin: Negative.   Neurological: Negative.   Endo/Heme/Allergies: Negative.   Psychiatric/Behavioral: Negative.     PHYSICAL EXAMINATION:    BP 122/80 (BP Location: Right Arm,  Patient Position: Sitting, Cuff Size: Normal)   Pulse 84   Temp 98.3 F (36.8 C) (Skin)   Wt 115 lb 6.4 oz (52.3 kg)   LMP 06/28/2018 (Exact Date)   BMI 21.11 kg/m     General appearance: alert, cooperative and appears stated age   Ultrasound images reviewed with the patient.  ASSESSMENT Pelvic pain, ultrasound with echogenic fluid surrounding the left adnexa. Slightly atypical appearing left ovarian cyst and 2 other adnexal cysts (I suspect paratubal cysts, less likely hydrosalpinx). No h/o STD's, recent negative genprobe.  Contraception, off of OCP's, using condoms    PLAN Return for repeat u/s in 2-3 months Is considering the mirena IUD, knows she needs to be on her cycle for insertion. Has information We did discuss that the mirena does increase ovarian cyst formation    An After Visit Summary was printed and given to the patient.

## 2018-07-24 ENCOUNTER — Other Ambulatory Visit: Payer: Self-pay

## 2018-07-24 ENCOUNTER — Encounter: Payer: Self-pay | Admitting: Obstetrics and Gynecology

## 2018-07-24 ENCOUNTER — Ambulatory Visit (INDEPENDENT_AMBULATORY_CARE_PROVIDER_SITE_OTHER): Payer: Managed Care, Other (non HMO)

## 2018-07-24 ENCOUNTER — Ambulatory Visit (INDEPENDENT_AMBULATORY_CARE_PROVIDER_SITE_OTHER): Payer: Managed Care, Other (non HMO) | Admitting: Obstetrics and Gynecology

## 2018-07-24 VITALS — BP 122/80 | HR 84 | Temp 98.3°F | Wt 115.4 lb

## 2018-07-24 DIAGNOSIS — N949 Unspecified condition associated with female genital organs and menstrual cycle: Secondary | ICD-10-CM | POA: Diagnosis not present

## 2018-07-24 DIAGNOSIS — R198 Other specified symptoms and signs involving the digestive system and abdomen: Secondary | ICD-10-CM

## 2018-07-24 DIAGNOSIS — N946 Dysmenorrhea, unspecified: Secondary | ICD-10-CM

## 2018-07-24 DIAGNOSIS — R102 Pelvic and perineal pain unspecified side: Secondary | ICD-10-CM

## 2018-07-24 DIAGNOSIS — R6889 Other general symptoms and signs: Secondary | ICD-10-CM

## 2018-07-24 DIAGNOSIS — Z3009 Encounter for other general counseling and advice on contraception: Secondary | ICD-10-CM

## 2018-08-02 ENCOUNTER — Telehealth: Payer: Self-pay | Admitting: Obstetrics and Gynecology

## 2018-08-02 DIAGNOSIS — Z3009 Encounter for other general counseling and advice on contraception: Secondary | ICD-10-CM

## 2018-08-02 NOTE — Telephone Encounter (Signed)
Patient sent the following message through [place]. Routing to triage to assist patient with request.  Appointment Request From: Jennifer Knight    With Provider: Salvadore Dom, MD [facility]]    Preferred Date Range: 08/06/2018 - 08/09/2018    Preferred Times: Any Time    Reason for visit: Request an Appointment    Comments:  Placement of an IUD.

## 2018-08-02 NOTE — Telephone Encounter (Signed)
Spoke with patient, requesting to schedule Mirena IUD insertion. LMP 08/02/18. IUD insertion scheduled for 08/06/18 at 3:30pm, order placed for precert. Advised to take Motrin 800 mg with food and water one hour before procedure. Patient verbalizes understanding and is agreeable.   Routing to provider for final review. Patient is agreeable to disposition. Will close encounter.  Cc: Lerry Liner

## 2018-08-06 ENCOUNTER — Encounter: Payer: Self-pay | Admitting: Obstetrics and Gynecology

## 2018-08-06 ENCOUNTER — Other Ambulatory Visit: Payer: Self-pay

## 2018-08-06 ENCOUNTER — Ambulatory Visit (INDEPENDENT_AMBULATORY_CARE_PROVIDER_SITE_OTHER): Payer: Managed Care, Other (non HMO) | Admitting: Obstetrics and Gynecology

## 2018-08-06 VITALS — BP 120/72 | HR 84 | Temp 98.0°F | Wt 116.8 lb

## 2018-08-06 DIAGNOSIS — Z3009 Encounter for other general counseling and advice on contraception: Secondary | ICD-10-CM

## 2018-08-06 DIAGNOSIS — Z01812 Encounter for preprocedural laboratory examination: Secondary | ICD-10-CM | POA: Diagnosis not present

## 2018-08-06 DIAGNOSIS — Z30430 Encounter for insertion of intrauterine contraceptive device: Secondary | ICD-10-CM

## 2018-08-06 DIAGNOSIS — R55 Syncope and collapse: Secondary | ICD-10-CM | POA: Diagnosis not present

## 2018-08-06 LAB — POCT URINE PREGNANCY: Preg Test, Ur: NEGATIVE

## 2018-08-06 NOTE — Patient Instructions (Signed)
IUD Post-procedure Instructions Cramping is common.  You may take Ibuprofen, Aleve, or Tylenol for the cramping.  This should resolve within 24 hours.   You may have a small amount of spotting.  You should wear a mini pad for the next few days. You may have intercourse in 24 hours. You need to call the office if you have any pelvic pain, fever, heavy bleeding, or foul smelling vaginal discharge. Shower or bathe as normal Use condoms for one week  

## 2018-08-06 NOTE — Progress Notes (Signed)
GYNECOLOGY  VISIT   HPI: 23 y.o.   Married White or Caucasian Not Hispanic or Latino  female   G0P0000 with Patient's last menstrual period was 08/02/2018 (exact date).   here for  Mirena IUD insertion. Recent negative genprobe.  GYNECOLOGIC HISTORY: Patient's last menstrual period was 08/02/2018 (exact date). Contraception: Condoms Menopausal hormone therapy: None        OB History    Gravida  0   Para  0   Term  0   Preterm  0   AB  0   Living  0     SAB  0   TAB  0   Ectopic  0   Multiple  0   Live Births  0              Patient Active Problem List   Diagnosis Date Noted  . Migraine with aura and without status migrainosus, not intractable 07/10/2018    Past Medical History:  Diagnosis Date  . Anxiety   . Dysmenorrhea   . Migraine with aura     Past Surgical History:  Procedure Laterality Date  . HERNIA REPAIR      Current Outpatient Medications  Medication Sig Dispense Refill  . Ascorbic Acid (VITAMIN C PO) Take 2 tablets by mouth daily.    . betamethasone valerate ointment (VALISONE) 0.1 % Apply a pea sized amount topically bid for 1-2 weeks as needed. Not for daily long term use. 15 g 0  . bismuth subsalicylate (PEPTO BISMOL) 262 MG/15ML suspension Take 30 mLs by mouth every 6 (six) hours as needed for indigestion.    Marland Kitchen. ibuprofen (ADVIL,MOTRIN) 800 MG tablet Take 1 tablet (800 mg total) by mouth every 8 (eight) hours as needed. 30 tablet 1  . ondansetron (ZOFRAN) 4 MG tablet Take 1 tablet (4 mg total) by mouth every 6 (six) hours as needed for nausea or vomiting. 12 tablet 0   No current facility-administered medications for this visit.      ALLERGIES: Patient has no known allergies.  Family History  Problem Relation Age of Onset  . Fibroids Mother     Social History   Socioeconomic History  . Marital status: Married    Spouse name: Not on file  . Number of children: Not on file  . Years of education: Not on file  . Highest  education level: Not on file  Occupational History  . Not on file  Social Needs  . Financial resource strain: Not on file  . Food insecurity    Worry: Not on file    Inability: Not on file  . Transportation needs    Medical: Not on file    Non-medical: Not on file  Tobacco Use  . Smoking status: Never Smoker  . Smokeless tobacco: Never Used  Substance and Sexual Activity  . Alcohol use: Yes    Alcohol/week: 4.0 - 5.0 standard drinks    Types: 4 - 5 Standard drinks or equivalent per week  . Drug use: No  . Sexual activity: Yes    Birth control/protection: Condom  Lifestyle  . Physical activity    Days per week: Not on file    Minutes per session: Not on file  . Stress: Not on file  Relationships  . Social Musicianconnections    Talks on phone: Not on file    Gets together: Not on file    Attends religious service: Not on file    Active member of club  or organization: Not on file    Attends meetings of clubs or organizations: Not on file    Relationship status: Not on file  . Intimate partner violence    Fear of current or ex partner: Not on file    Emotionally abused: Not on file    Physically abused: Not on file    Forced sexual activity: Not on file  Other Topics Concern  . Not on file  Social History Narrative  . Not on file    Review of Systems  Constitutional: Negative.   HENT: Negative.   Eyes: Negative.   Respiratory: Negative.   Cardiovascular: Negative.   Genitourinary: Negative.   Musculoskeletal: Negative.   Skin: Negative.   Neurological: Negative.   Endo/Heme/Allergies: Negative.   Psychiatric/Behavioral: Negative.     PHYSICAL EXAMINATION:    BP 120/72 (BP Location: Right Arm, Patient Position: Sitting, Cuff Size: Normal)   Pulse 84   Temp 98 F (36.7 C) (Skin)   Wt 116 lb 12.8 oz (53 kg)   LMP 08/02/2018 (Exact Date)   BMI 21.36 kg/m     General appearance: alert, cooperative and appears stated age  Pelvic: External genitalia:  no lesions               Urethra:  normal appearing urethra with no masses, tenderness or lesions              Bartholins and Skenes: normal                 Vagina: normal appearing vagina with normal color and discharge, no lesions              Cervix: no lesions              The risks of the mirena IUD were reviewed with the patient, including infection, abnormal bleeding and uterine perfortion. Consent was signed.  A speculum was placed in the vagina, the cervix was cleansed with betadine. A tenaculum was placed on the cervix, the uterus sounded to 8 cm. The cervix was dilated to a 5 hagar dilator  The mirena IUD was inserted, the patient started to c/o feeling lightheaded and passed out just as the IUD was placed. The speculum and tenaculum were still in place. I help the patient's legs and my assistant was able to arouse the patient with smelling salts. The tenaculum was removed. The string were cut to 3-4 cm. The tenaculum was removed. Slight oozing from the tenaculum site was stopped with pressure.   The patient tolerated was given soda and crackers, reported feeling fine after the procedure was over.   Post IUD insertion ultrasound, the IUD is in place   Chaperone was present for exam.  ASSESSMENT Mirena IUD insertion Vagal event, doing fine    PLAN F/U in one month for IUD check She is scheduled for an ultrasound to check her adnexa in a few months    An After Visit Summary was printed and given to the patient.

## 2018-09-06 ENCOUNTER — Encounter: Payer: Self-pay | Admitting: Obstetrics and Gynecology

## 2018-09-06 ENCOUNTER — Ambulatory Visit (INDEPENDENT_AMBULATORY_CARE_PROVIDER_SITE_OTHER): Payer: Managed Care, Other (non HMO) | Admitting: Obstetrics and Gynecology

## 2018-09-06 ENCOUNTER — Other Ambulatory Visit: Payer: Self-pay

## 2018-09-06 VITALS — BP 118/70 | HR 64 | Temp 98.7°F | Wt 116.4 lb

## 2018-09-06 DIAGNOSIS — Z30431 Encounter for routine checking of intrauterine contraceptive device: Secondary | ICD-10-CM | POA: Diagnosis not present

## 2018-09-06 DIAGNOSIS — N946 Dysmenorrhea, unspecified: Secondary | ICD-10-CM | POA: Diagnosis not present

## 2018-09-06 DIAGNOSIS — N949 Unspecified condition associated with female genital organs and menstrual cycle: Secondary | ICD-10-CM

## 2018-09-06 MED ORDER — IBUPROFEN 800 MG PO TABS: 800.0000 mg | ORAL_TABLET | Freq: Three times a day (TID) | ORAL | 2 refills | Status: DC | PRN

## 2018-09-06 NOTE — Progress Notes (Signed)
GYNECOLOGY  VISIT   HPI: 23 y.o.   Married White or Caucasian Not Hispanic or Latino  female   G0P0000 with Patient's last menstrual period was 08/29/2018 (exact date).   here for IUD check.    She had a mirena IUD inserted on 08/06/18. She had a vagal episode with insertion. Post insertion ultrasound the IUD was in place. She had cramping for the first few weeks. She has had spotting since insertion, started her cycle on 08/29/18, feels like a normal cycle to her, cramps are just as bad. Helped with ibuprofen. No dyspareunia.  She went off of OCP's in 5/20 for new onset migraines with auras.   GYNECOLOGIC HISTORY: Patient's last menstrual period was 08/29/2018 (exact date). Contraception: IUD Menopausal hormone therapy: None        OB History    Gravida  0   Para  0   Term  0   Preterm  0   AB  0   Living  0     SAB  0   TAB  0   Ectopic  0   Multiple  0   Live Births  0              Patient Active Problem List   Diagnosis Date Noted  . Migraine with aura and without status migrainosus, not intractable 07/10/2018    Past Medical History:  Diagnosis Date  . Anxiety   . Dysmenorrhea   . Migraine with aura     Past Surgical History:  Procedure Laterality Date  . HERNIA REPAIR      Current Outpatient Medications  Medication Sig Dispense Refill  . Ascorbic Acid (VITAMIN C PO) Take 2 tablets by mouth daily.    . betamethasone valerate ointment (VALISONE) 0.1 % Apply a pea sized amount topically bid for 1-2 weeks as needed. Not for daily long term use. 15 g 0  . bismuth subsalicylate (PEPTO BISMOL) 262 MG/15ML suspension Take 30 mLs by mouth every 6 (six) hours as needed for indigestion.    Marland Kitchen. ibuprofen (ADVIL,MOTRIN) 800 MG tablet Take 1 tablet (800 mg total) by mouth every 8 (eight) hours as needed. 30 tablet 1  . levonorgestrel (MIRENA) 20 MCG/24HR IUD 1 each by Intrauterine route once.    . ondansetron (ZOFRAN) 4 MG tablet Take 1 tablet (4 mg total) by  mouth every 6 (six) hours as needed for nausea or vomiting. 12 tablet 0   No current facility-administered medications for this visit.      ALLERGIES: Patient has no known allergies.  Family History  Problem Relation Age of Onset  . Fibroids Mother     Social History   Socioeconomic History  . Marital status: Married    Spouse name: Not on file  . Number of children: Not on file  . Years of education: Not on file  . Highest education level: Not on file  Occupational History  . Not on file  Social Needs  . Financial resource strain: Not on file  . Food insecurity    Worry: Not on file    Inability: Not on file  . Transportation needs    Medical: Not on file    Non-medical: Not on file  Tobacco Use  . Smoking status: Never Smoker  . Smokeless tobacco: Never Used  Substance and Sexual Activity  . Alcohol use: Yes    Alcohol/week: 4.0 - 5.0 standard drinks    Types: 4 - 5 Standard drinks or equivalent  per week  . Drug use: No  . Sexual activity: Yes    Birth control/protection: I.U.D.  Lifestyle  . Physical activity    Days per week: Not on file    Minutes per session: Not on file  . Stress: Not on file  Relationships  . Social Herbalist on phone: Not on file    Gets together: Not on file    Attends religious service: Not on file    Active member of club or organization: Not on file    Attends meetings of clubs or organizations: Not on file    Relationship status: Not on file  . Intimate partner violence    Fear of current or ex partner: Not on file    Emotionally abused: Not on file    Physically abused: Not on file    Forced sexual activity: Not on file  Other Topics Concern  . Not on file  Social History Narrative  . Not on file    Review of Systems  Constitutional: Negative.   HENT: Negative.   Eyes: Negative.   Respiratory: Negative.   Cardiovascular: Negative.   Gastrointestinal: Negative.   Genitourinary:       Menstrual changes   Musculoskeletal: Negative.   Skin: Negative.   Neurological: Negative.   Psychiatric/Behavioral: Negative.     PHYSICAL EXAMINATION:    BP 118/70 (BP Location: Right Arm, Patient Position: Sitting, Cuff Size: Normal)   Pulse 64   Temp 98.7 F (37.1 C) (Skin)   Wt 116 lb 6.4 oz (52.8 kg)   LMP 08/29/2018 (Exact Date)   BMI 21.29 kg/m     General appearance: alert, cooperative and appears stated age  Pelvic: External genitalia:  no lesions              Urethra:  normal appearing urethra with no masses, tenderness or lesions              Bartholins and Skenes: normal                 Vagina: normal appearing vagina with normal color and discharge, no lesions              Cervix: no cervical motion tenderness, no lesions and IUD string 4 cm              Bimanual Exam:  Uterus:  normal size, contour, position, consistency, mobility, non-tender              Adnexa: no mass, fullness, tenderness                Chaperone was present for exam.  ASSESSMENT IUD check, on her first cycle since IUD insertion, not improved from her prior cycle. Normal exam Severe dysmenorrhea H/O Adnexal cyst    PLAN Ibuprofen script sent Anticipate her cramps and bleeding will improve with time.  F/U ultrasound end of August to end of September   An After Visit Summary was printed and given to the patient.

## 2018-09-20 NOTE — Progress Notes (Deleted)
GYNECOLOGY  VISIT   HPI: 23 y.o.   Married White or Caucasian Not Hispanic or Latino  female   G0P0000 with Patient's last menstrual period was 08/29/2018 (exact date).   here for     GYNECOLOGIC HISTORY: Patient's last menstrual period was 08/29/2018 (exact date). Contraception:*** Menopausal hormone therapy: ***        OB History    Gravida  0   Para  0   Term  0   Preterm  0   AB  0   Living  0     SAB  0   TAB  0   Ectopic  0   Multiple  0   Live Births  0              Patient Active Problem List   Diagnosis Date Noted  . Migraine with aura and without status migrainosus, not intractable 07/10/2018    Past Medical History:  Diagnosis Date  . Anxiety   . Dysmenorrhea   . Migraine with aura     Past Surgical History:  Procedure Laterality Date  . HERNIA REPAIR      Current Outpatient Medications  Medication Sig Dispense Refill  . Ascorbic Acid (VITAMIN C PO) Take 2 tablets by mouth daily.    . betamethasone valerate ointment (VALISONE) 0.1 % Apply a pea sized amount topically bid for 1-2 weeks as needed. Not for daily long term use. 15 g 0  . bismuth subsalicylate (PEPTO BISMOL) 262 MG/15ML suspension Take 30 mLs by mouth every 6 (six) hours as needed for indigestion.    Marland Kitchen ibuprofen (ADVIL) 800 MG tablet Take 1 tablet (800 mg total) by mouth every 8 (eight) hours as needed. 30 tablet 2  . levonorgestrel (MIRENA) 20 MCG/24HR IUD 1 each by Intrauterine route once.    . ondansetron (ZOFRAN) 4 MG tablet Take 1 tablet (4 mg total) by mouth every 6 (six) hours as needed for nausea or vomiting. 12 tablet 0   No current facility-administered medications for this visit.      ALLERGIES: Patient has no known allergies.  Family History  Problem Relation Age of Onset  . Fibroids Mother     Social History   Socioeconomic History  . Marital status: Married    Spouse name: Not on file  . Number of children: Not on file  . Years of education: Not on  file  . Highest education level: Not on file  Occupational History  . Not on file  Social Needs  . Financial resource strain: Not on file  . Food insecurity    Worry: Not on file    Inability: Not on file  . Transportation needs    Medical: Not on file    Non-medical: Not on file  Tobacco Use  . Smoking status: Never Smoker  . Smokeless tobacco: Never Used  Substance and Sexual Activity  . Alcohol use: Yes    Alcohol/week: 4.0 - 5.0 standard drinks    Types: 4 - 5 Standard drinks or equivalent per week  . Drug use: No  . Sexual activity: Yes    Birth control/protection: I.U.D.  Lifestyle  . Physical activity    Days per week: Not on file    Minutes per session: Not on file  . Stress: Not on file  Relationships  . Social Herbalist on phone: Not on file    Gets together: Not on file    Attends religious service:  Not on file    Active member of club or organization: Not on file    Attends meetings of clubs or organizations: Not on file    Relationship status: Not on file  . Intimate partner violence    Fear of current or ex partner: Not on file    Emotionally abused: Not on file    Physically abused: Not on file    Forced sexual activity: Not on file  Other Topics Concern  . Not on file  Social History Narrative  . Not on file    ROS  PHYSICAL EXAMINATION:    LMP 08/29/2018 (Exact Date)     General appearance: alert, cooperative and appears stated age Neck: no adenopathy, supple, symmetrical, trachea midline and thyroid {CHL AMB PHY EX THYROID NORM [phone]::"normal to inspection and palpation"} Breasts: {Exam; breast:13139::"normal appearance, no masses or tenderness"} Abdomen: soft, non-tender; non distended, no masses,  no organomegaly  Pelvic: External genitalia:  no lesions              Urethra:  normal appearing urethra with no masses, tenderness or lesions              Bartholins and Skenes: normal                 Vagina: normal  appearing vagina with normal color and discharge, no lesions              Cervix: {CHL AMB PHY EX CERVIX NORM [phone]::"no lesions"}              Bimanual Exam:  Uterus:  {CHL AMB PHY EX UTERUS NORM DEFAULT:[phone]::"normal size, contour, position, consistency, mobility, non-tender"}              Adnexa: {CHL AMB PHY EX ADNEXA NO MASS DEFAULT:[phone]::"no mass, fullness, tenderness"}              Rectovaginal: {yes no:[id number]}.  Confirms.              Anus:  normal sphincter tone, no lesions  Chaperone was present for exam.  ASSESSMENT     PLAN    An After Visit Summary was printed and given to the patient.  *** minutes face to face time of which over 50% was spent in counseling.

## 2018-09-20 NOTE — Progress Notes (Signed)
23 y.o. G0P0000 Married White or Caucasian Not Hispanic or Latino female here for annual exam.  Mirena IUD inserted on 6/20. Her first cycle after the IUD was inserted was 2 weeks. Occasional spotting. Cramping, taking ibuprofen 1 x a day which helps. No dyspareunia. Cramps with her one cycle felt the same.  She was seen in 5/20 for pelvic pain. U/S in 6/20 with left ovarian cyst, other adnexal cysts and echogenic fluid. That pain is better.   Period Duration (Days): 14 days Period Pattern: (!) Irregular Menstrual Flow: Moderate, Light Menstrual Control: Maxi pad, Thin pad Menstrual Control Change Freq (Hours): changes pad twice a day Dysmenorrhea: (!) Severe Dysmenorrhea Symptoms: Cramping  Patient's last menstrual period was 08/29/2018 (exact date).          Sexually active: Yes.    The current method of family planning is IUD.    Exercising: Yes.    yoga Smoker:  no  Health Maintenance: [facility]:  [facility] History of abnormal Pap:  no TDaP:  Up to date Gardasil: completed all 3   reports that she has never smoked. She has never used smokeless tobacco. She reports current alcohol use of about 3.0 - 4.0 standard drinks of alcohol per week. She reports that she does not use drugs. She is getting her masters in Biology from [employer], one more year. Ultimately she wants to get her PhD in [place]. Her husband works in Engineer, technical sales and is just starting a Counsellor in Engineer, technical sales.   Past Medical History:  Diagnosis Date  . Anxiety   . Dysmenorrhea   . Migraine with aura     Past Surgical History:  Procedure Laterality Date  . HERNIA REPAIR      Current Outpatient Medications  Medication Sig Dispense Refill  . Ascorbic Acid (VITAMIN C PO) Take 2 tablets by mouth daily.    . betamethasone valerate ointment (VALISONE) 0.1 % Apply a pea sized amount topically bid for 1-2 weeks as needed. Not for daily long term use. 15 g 0  . bismuth subsalicylate (PEPTO BISMOL) 262  MG/15ML suspension Take 30 mLs by mouth every 6 (six) hours as needed for indigestion.    Marland Kitchen ibuprofen (ADVIL) 800 MG tablet Take 1 tablet (800 mg total) by mouth every 8 (eight) hours as needed. 30 tablet 2  . levonorgestrel (MIRENA) 20 MCG/24HR IUD 1 each by Intrauterine route once.    . ondansetron (ZOFRAN) 4 MG tablet Take 1 tablet (4 mg total) by mouth every 6 (six) hours as needed for nausea or vomiting. 12 tablet 0   No current facility-administered medications for this visit.     Family History  Problem Relation Age of Onset  . Fibroids Mother     Review of Systems  Constitutional: Negative.   HENT: Negative.   Eyes: Negative.   Respiratory: Negative.   Cardiovascular: Negative.   Gastrointestinal: Negative.   Endocrine: Negative.   Genitourinary: Positive for menstrual problem.  Musculoskeletal: Negative.   Skin: Negative.   Allergic/Immunologic: Negative.   Neurological: Negative.   Hematological: Negative.   Psychiatric/Behavioral: Negative.     Exam:   BP 108/72 (BP Location: Right Arm, Patient Position: Sitting, Cuff Size: Normal)   Pulse 76   Temp 98.3 F (36.8 C) (Skin)   Ht 5' 3.78" (1.62 m)   Wt 115 lb (52.2 kg)   LMP 08/29/2018 (Exact Date)   BMI 19.88 kg/m   Weight change: @WEIGHTCHANGE @ Height:   Height:  5' 3.78" (162 cm)  Ht Readings from Last 3 Encounters:  09/26/18 5' 3.78" (1.62 m)  05/17/18 5\' 2"  (1.575 m)  11/29/17 5\' 4"  (1.626 m)    General appearance: alert, cooperative and appears stated age Head: Normocephalic, without obvious abnormality, atraumatic Neck: no adenopathy, supple, symmetrical, trachea midline and thyroid normal to inspection and palpation Lungs: clear to auscultation bilaterally Cardiovascular: regular rate and rhythm Breasts: normal appearance, no masses or tenderness Abdomen: soft, non-tender; non distended,  no masses,  no organomegaly Extremities: extremities normal, atraumatic, no cyanosis or edema Skin: Skin  color, texture, turgor normal. No rashes or lesions Lymph nodes: Cervical, supraclavicular, and axillary nodes normal. No abnormal inguinal nodes palpated Neurologic: Grossly normal   Pelvic: External genitalia:  no lesions              Urethra:  normal appearing urethra with no masses, tenderness or lesions              Bartholins and Skenes: normal                 Vagina: normal appearing vagina with normal color and discharge, no lesions              Cervix: no lesions and IUD string 1 cm               Bimanual Exam:  Uterus:  normal size, contour, position, consistency, mobility, non-tender and retroverted              Adnexa: no masses, mildly tender on the left               Rectovaginal: Confirms               Anus:  normal sphincter tone, no lesions  Chaperone was present for exam.  A:  Well Woman with normal exam  IUD check, placed last month, still spotting and mild cramping  H/O left adnexal cyst and fluid collection  P:   Negative genprobe in 5/20  Pap today  Return for f/u gyn ultrasound   Screening labs   Call if cramping and spotting don't improve over the next few months.   Discussed breast self exam  Discussed calcium and vit D intake

## 2018-09-24 ENCOUNTER — Telehealth: Payer: Self-pay | Admitting: Obstetrics and Gynecology

## 2018-09-24 NOTE — Telephone Encounter (Signed)
Left message for patient to return my call to reschedule [place] appt for 09/25/2018. Can reschedule for 09/27/2018 or 10/02/2018.

## 2018-09-25 ENCOUNTER — Other Ambulatory Visit: Payer: Managed Care, Other (non HMO) | Admitting: Obstetrics and Gynecology

## 2018-09-25 ENCOUNTER — Other Ambulatory Visit: Payer: Managed Care, Other (non HMO)

## 2018-09-25 NOTE — Telephone Encounter (Signed)
Spoke with patient in regards to rescheduling aprointment form 09/25/2018. Patient is rescheduled 09/27/2018 at 8:00 AM with Dr Talbert Nan. Patient is aware of appointment date and arrival time. No further questions. Will close encounter   cc: Lamont Snowball, RN  cc: Thayer Ohm

## 2018-09-26 ENCOUNTER — Other Ambulatory Visit (HOSPITAL_COMMUNITY)
Admission: RE | Admit: 2018-09-26 | Discharge: 2018-09-26 | Disposition: A | Discharge status: Home / Self Care | Payer: Managed Care, Other (non HMO) | Source: Ambulatory Visit | Attending: Obstetrics and Gynecology | Admitting: Obstetrics and Gynecology

## 2018-09-26 ENCOUNTER — Ambulatory Visit (INDEPENDENT_AMBULATORY_CARE_PROVIDER_SITE_OTHER): Payer: Managed Care, Other (non HMO) | Admitting: Obstetrics and Gynecology

## 2018-09-26 ENCOUNTER — Encounter: Payer: Self-pay | Admitting: Obstetrics and Gynecology

## 2018-09-26 ENCOUNTER — Other Ambulatory Visit: Payer: Self-pay

## 2018-09-26 VITALS — BP 108/72 | HR 76 | Temp 98.3°F | Ht 63.78 in | Wt 115.0 lb

## 2018-09-26 DIAGNOSIS — Z01419 Encounter for gynecological examination (general) (routine) without abnormal findings: Secondary | ICD-10-CM | POA: Diagnosis not present

## 2018-09-26 DIAGNOSIS — Z124 Encounter for screening for malignant neoplasm of cervix: Secondary | ICD-10-CM | POA: Insufficient documentation

## 2018-09-26 DIAGNOSIS — Z0000 Encounter for general adult medical examination without abnormal findings: Secondary | ICD-10-CM

## 2018-09-26 DIAGNOSIS — Z30431 Encounter for routine checking of intrauterine contraceptive device: Secondary | ICD-10-CM | POA: Diagnosis not present

## 2018-09-26 NOTE — Progress Notes (Signed)
GYNECOLOGY  VISIT   HPI: 23 y.o.   Married White or Caucasian Not Hispanic or Latino  female   [id number] with Patient's last menstrual period was 08/29/2018 (exact date).   here for consult following PUS. She had a complex collection in the left adnexa 2 months ago and a ? Of a hydrosalpinx vs paratubal cysts. She developed right sided pelvic pain last night, came on subtly but then got severe, Ibuprofen and a heating pad weren't enough. The pain was up to a 9/10 in severity. Feeling a little better at the moment. The pain has been intermittent. Tender with ultrasound this morning.   GYNECOLOGIC HISTORY: Patient's last menstrual period was 08/29/2018 (exact date). Contraception: IUD Menopausal hormone therapy: None        OB History    Gravida  0   Para  0   Term  0   Preterm  0   AB  0   Living  0     SAB  0   TAB  0   Ectopic  0   Multiple  0   Live Births  0              Patient Active Problem List   Diagnosis Date Noted  . Migraine with aura and without status migrainosus, not intractable 07/10/2018    Past Medical History:  Diagnosis Date  . Anxiety   . Dysmenorrhea   . Migraine with aura     Past Surgical History:  Procedure Laterality Date  . HERNIA REPAIR      Current Outpatient Medications  Medication Sig Dispense Refill  . Ascorbic Acid (VITAMIN C PO) Take 2 tablets by mouth daily.    . betamethasone valerate ointment (VALISONE) 0.1 % Apply a pea sized amount topically bid for 1-2 weeks as needed. Not for daily long term use. 15 g 0  . bismuth subsalicylate (PEPTO BISMOL) 262 MG/15ML suspension Take 30 mLs by mouth every 6 (six) hours as needed for indigestion.    Marland Kitchen. ibuprofen (ADVIL) 800 MG tablet Take 1 tablet (800 mg total) by mouth every 8 (eight) hours as needed. 30 tablet 2  . levonorgestrel (MIRENA) 20 MCG/24HR IUD 1 each by Intrauterine route once.    . ondansetron (ZOFRAN) 4 MG tablet Take 1 tablet (4 mg total) by mouth every 6 (six)  hours as needed for nausea or vomiting. 12 tablet 0  . oxyCODONE-acetaminophen (PERCOCET) 5-325 MG tablet Take 1 tablet by mouth every 4 (four) hours as needed. use only as much as needed to relieve pain 5 tablet 0   No current facility-administered medications for this visit.      ALLERGIES: Patient has no known allergies.  Family History  Problem Relation Age of Onset  . Fibroids Mother     Social History   Socioeconomic History  . Marital status: Married    Spouse name: Not on file  . Number of children: Not on file  . Years of education: Not on file  . Highest education level: Not on file  Occupational History  . Not on file  Social Needs  . Financial resource strain: Not on file  . Food insecurity    Worry: Not on file    Inability: Not on file  . Transportation needs    Medical: Not on file    Non-medical: Not on file  Tobacco Use  . Smoking status: Never Smoker  . Smokeless tobacco: Never Used  Substance and Sexual Activity  .  Alcohol use: Yes    Alcohol/week: 3.0 - 4.0 standard drinks    Types: 3 - 4 Standard drinks or equivalent per week  . Drug use: No  . Sexual activity: Yes    Birth control/protection: I.U.D.  Lifestyle  . Physical activity    Days per week: Not on file    Minutes per session: Not on file  . Stress: Not on file  Relationships  . Social Herbalist on phone: Not on file    Gets together: Not on file    Attends religious service: Not on file    Active member of club or organization: Not on file    Attends meetings of clubs or organizations: Not on file    Relationship status: Not on file  . Intimate partner violence    Fear of current or ex partner: Not on file    Emotionally abused: Not on file    Physically abused: Not on file    Forced sexual activity: Not on file  Other Topics Concern  . Not on file  Social History Narrative  . Not on file    ROS: no fever, slight nausea, no emesis, no urinary c/o,  constipation  PHYSICAL EXAMINATION:    BP 112/78 (BP Location: Right Arm, Patient Position: Sitting, Cuff Size: Normal)   Pulse 64   Temp 98.4 F (36.9 C) (Skin)   Wt 115 lb (52.2 kg)   LMP 08/29/2018 (Exact Date)   BMI 19.88 kg/m     General appearance: alert, cooperative and appears stated age Abdomen: soft, tender in the RLQ, no rebound, no guarding; non distended, no masses,  no organomegaly Pelvic deferred  Ultrasound images reviewed with the patient. Normal left ovary, normal uterus, IUD in place. 2 small cysts in the left adnexa, suspect paratubal cysts. In the right ovary she has a 4.7 cm complex cyst, most c/w a hemorrhagic CL.  ASSESSMENT Left pelvic fluid collection has resolved 2 small cysts in the left adnexa, suspect paratubal cysts New onset of pelvic pain last night Large, suspected hemorrhagic cyst in the right ovary    PLAN Discussed risk of rupture or torsion Her pain has been intermittent since last night, severe at times. Tender on exam Pain not controlled with ibuprofen an heat Limited # of percocet called in, she needs to call if her pain is severe  F/U ultrasound in 3 months Call with any concerns   An After Visit Summary was printed and given to the patient.

## 2018-09-26 NOTE — Patient Instructions (Signed)
EXERCISE AND DIET:  We recommended that you start or continue a regular exercise program for good health. Regular exercise means any activity that makes your heart beat faster and makes you sweat.  We recommend exercising at least 30 minutes per day at least 3 days a week, preferably 4 or 5.  We also recommend a diet low in fat and sugar.  Inactivity, poor dietary choices and obesity can cause diabetes, heart attack, stroke, and kidney damage, among others.    ALCOHOL AND SMOKING:  Women should limit their alcohol intake to no more than 7 drinks/beers/glasses of wine (combined, not each!) per week. Moderation of alcohol intake to this level decreases your risk of breast cancer and liver damage. And of course, no recreational drugs are part of a healthy lifestyle.  And absolutely no smoking or even second hand smoke. Most people know smoking can cause heart and lung diseases, but did you know it also contributes to weakening of your bones? Aging of your skin?  Yellowing of your teeth and nails?  CALCIUM AND VITAMIN D:  Adequate intake of calcium and Vitamin D are recommended.  The recommendations for exact amounts of these supplements seem to change often, but generally speaking 1,000 mg of calcium (between diet and supplement) and 800 units of Vitamin D per day seems prudent. Certain women may benefit from higher intake of Vitamin D.  If you are among these women, your doctor will have told you during your visit.    PAP SMEARS:  Pap smears, to check for cervical cancer or precancers,  have traditionally been done yearly, although recent scientific advances have shown that most women can have pap smears less often.  However, every woman still should have a physical exam from her gynecologist every year. It will include a breast check, inspection of the vulva and vagina to check for abnormal growths or skin changes, a visual exam of the cervix, and then an exam to evaluate the size and shape of the uterus and  ovaries.  And after 24 years of age, a rectal exam is indicated to check for rectal cancers. We will also provide age appropriate advice regarding health maintenance, like when you should have certain vaccines, screening for sexually transmitted diseases, bone density testing, colonoscopy, mammograms, etc.   MAMMOGRAMS:  All women over 40 years old should have a yearly mammogram. Many facilities now offer a "3D" mammogram, which may cost around $50 extra out of pocket. If possible,  we recommend you accept the option to have the 3D mammogram performed.  It both reduces the number of women who will be called back for extra views which then turn out to be normal, and it is better than the routine mammogram at detecting truly abnormal areas.    COLON CANCER SCREENING: Now recommend starting at age 45. At this time colonoscopy is not covered for routine screening until 50. There are take home tests that can be done between 45-49.   COLONOSCOPY:  Colonoscopy to screen for colon cancer is recommended for all women at age 50.  We know, you hate the idea of the prep.  We agree, BUT, having colon cancer and not knowing it is worse!!  Colon cancer so often starts as a polyp that can be seen and removed at colonscopy, which can quite literally save your life!  And if your first colonoscopy is normal and you have no family history of colon cancer, most women don't have to have it again for   10 years.  Once every ten years, you can do something that may end up saving your life, right?  We will be happy to help you get it scheduled when you are ready.  Be sure to check your insurance coverage so you understand how much it will cost.  It may be covered as a preventative service at no cost, but you should check your particular policy.      Breast Self-Awareness Breast self-awareness means being familiar with how your breasts look and feel. It involves checking your breasts regularly and reporting any changes to your  health care provider. Practicing breast self-awareness is important. A change in your breasts can be a sign of a serious medical problem. Being familiar with how your breasts look and feel allows you to find any problems early, when treatment is more likely to be successful. All women should practice breast self-awareness, including women who have had breast implants. How to do a breast self-exam One way to learn what is normal for your breasts and whether your breasts are changing is to do a breast self-exam. To do a breast self-exam: Look for Changes  1. Remove all the clothing above your waist. 2. Stand in front of a mirror in a room with good lighting. 3. Put your hands on your hips. 4. Push your hands firmly downward. 5. Compare your breasts in the mirror. Look for differences between them (asymmetry), such as: ? Differences in shape. ? Differences in size. ? Puckers, dips, and bumps in one breast and not the other. 6. Look at each breast for changes in your skin, such as: ? Redness. ? Scaly areas. 7. Look for changes in your nipples, such as: ? Discharge. ? Bleeding. ? Dimpling. ? Redness. ? A change in position. Feel for Changes Carefully feel your breasts for lumps and changes. It is best to do this while lying on your back on the floor and again while sitting or standing in the shower or tub with soapy water on your skin. Feel each breast in the following way:  Place the arm on the side of the breast you are examining above your head.  Feel your breast with the other hand.  Start in the nipple area and make  inch (2 cm) overlapping circles to feel your breast. Use the pads of your three middle fingers to do this. Apply light pressure, then medium pressure, then firm pressure. The light pressure will allow you to feel the tissue closest to the skin. The medium pressure will allow you to feel the tissue that is a little deeper. The firm pressure will allow you to feel the tissue  close to the ribs.  Continue the overlapping circles, moving downward over the breast until you feel your ribs below your breast.  Move one finger-width toward the center of the body. Continue to use the  inch (2 cm) overlapping circles to feel your breast as you move slowly up toward your collarbone.  Continue the up and down exam using all three pressures until you reach your armpit.  Write Down What You Find  Write down what is normal for each breast and any changes that you find. Keep a written record with breast changes or normal findings for each breast. By writing this information down, you do not need to depend only on memory for size, tenderness, or location. Write down where you are in your menstrual cycle, if you are still menstruating. If you are having trouble noticing differences   in your breasts, do not get discouraged. With time you will become more familiar with the variations in your breasts and more comfortable with the exam. How often should I examine my breasts? Examine your breasts every month. If you are breastfeeding, the best time to examine your breasts is after a feeding or after using a breast pump. If you menstruate, the best time to examine your breasts is 5-7 days after your period is over. During your period, your breasts are lumpier, and it may be more difficult to notice changes. When should I see my health care provider? See your health care provider if you notice:  A change in shape or size of your breasts or nipples.  A change in the skin of your breast or nipples, such as a reddened or scaly area.  Unusual discharge from your nipples.  A lump or thick area that was not there before.  Pain in your breasts.  Anything that concerns you.  

## 2018-09-27 ENCOUNTER — Other Ambulatory Visit: Payer: Self-pay

## 2018-09-27 ENCOUNTER — Other Ambulatory Visit: Payer: Managed Care, Other (non HMO)

## 2018-09-27 ENCOUNTER — Ambulatory Visit (INDEPENDENT_AMBULATORY_CARE_PROVIDER_SITE_OTHER): Payer: Managed Care, Other (non HMO) | Admitting: Obstetrics and Gynecology

## 2018-09-27 ENCOUNTER — Encounter: Payer: Self-pay | Admitting: Obstetrics and Gynecology

## 2018-09-27 ENCOUNTER — Ambulatory Visit (INDEPENDENT_AMBULATORY_CARE_PROVIDER_SITE_OTHER): Payer: Managed Care, Other (non HMO)

## 2018-09-27 VITALS — BP 112/78 | HR 64 | Temp 98.4°F | Wt 115.0 lb

## 2018-09-27 DIAGNOSIS — R6889 Other general symptoms and signs: Secondary | ICD-10-CM

## 2018-09-27 DIAGNOSIS — N949 Unspecified condition associated with female genital organs and menstrual cycle: Secondary | ICD-10-CM

## 2018-09-27 DIAGNOSIS — R198 Other specified symptoms and signs involving the digestive system and abdomen: Secondary | ICD-10-CM

## 2018-09-27 DIAGNOSIS — N83201 Unspecified ovarian cyst, right side: Secondary | ICD-10-CM

## 2018-09-27 DIAGNOSIS — R102 Pelvic and perineal pain: Secondary | ICD-10-CM

## 2018-09-27 LAB — CYTOLOGY - PAP: Diagnosis: NEGATIVE

## 2018-09-27 MED ORDER — OXYCODONE-ACETAMINOPHEN 5-325 MG PO TABS: 1.0000 | ORAL_TABLET | ORAL | 0 refills | Status: DC | PRN

## 2018-09-27 NOTE — Patient Instructions (Signed)
Ovarian Cyst     An ovarian cyst is a fluid-filled sac that forms on an ovary. The ovaries are small organs that produce eggs in women. Various types of cysts can form on the ovaries. Some may cause symptoms and require treatment. Most ovarian cysts go away on their own, are not cancerous (are benign), and do not cause problems. Common types of ovarian cysts include:  Functional (follicle) cysts. ? Occur during the menstrual cycle, and usually go away with the next menstrual cycle if you do not get pregnant. ? Usually cause no symptoms.  Endometriomas. ? Are cysts that form from the tissue that lines the uterus (endometrium). ? Are sometimes called "chocolate cysts" because they become filled with blood that turns brown. ? Can cause pain in the lower abdomen during intercourse and during your period.  Cystadenoma cysts. ? Develop from cells on the outside surface of the ovary. ? Can get very large and cause lower abdomen pain and pain with intercourse. ? Can cause severe pain if they twist or break open (rupture).  Dermoid cysts. ? Are sometimes found in both ovaries. ? May contain different kinds of body tissue, such as skin, teeth, hair, or cartilage. ? Usually do not cause symptoms unless they get very big.  Theca lutein cysts. ? Occur when too much of a certain hormone (human chorionic gonadotropin) is produced and overstimulates the ovaries to produce an egg. ? Are most common after having procedures used to assist with the conception of a baby (in vitro fertilization). What are the causes? Ovarian cysts may be caused by:  Ovarian hyperstimulation syndrome. This is a condition that can develop from taking fertility medicines. It causes multiple large ovarian cysts to form.  Polycystic ovarian syndrome (PCOS). This is a common hormonal disorder that can cause ovarian cysts, as well as problems with your period or fertility. What increases the risk? The following factors may  make you more likely to develop ovarian cysts:  Being overweight or obese.  Taking fertility medicines.  Taking certain forms of hormonal birth control.  Smoking. What are the signs or symptoms? Many ovarian cysts do not cause symptoms. If symptoms are present, they may include:  Pelvic pain or pressure.  Pain in the lower abdomen.  Pain during sex.  Abdominal swelling.  Abnormal menstrual periods.  Increasing pain with menstrual periods. How is this diagnosed? These cysts are commonly found during a routine pelvic exam. You may have tests to find out more about the cyst, such as:  Ultrasound.  X-ray of the pelvis.  CT scan.  MRI.  Blood tests. How is this treated? Many ovarian cysts go away on their own without treatment. Your health care provider may want to check your cyst regularly for 2-3 months to see if it changes. If you are in menopause, it is especially important to have your cyst monitored closely because menopausal women have a higher rate of ovarian cancer. When treatment is needed, it may include:  Medicines to help relieve pain.  A procedure to drain the cyst (aspiration).  Surgery to remove the whole cyst.  Hormone treatment or birth control pills. These methods are sometimes used to help dissolve a cyst. Follow these instructions at home:  Take over-the-counter and prescription medicines only as told by your health care provider.  Do not drive or use heavy machinery while taking prescription pain medicine.  Get regular pelvic exams and Pap tests as often as told by your health care provider.    Return to your normal activities as told by your health care provider. Ask your health care provider what activities are safe for you.  Do not use any products that contain nicotine or tobacco, such as cigarettes and e-cigarettes. If you need help quitting, ask your health care provider.  Keep all follow-up visits as told by your health care provider.  This is important. Contact a health care provider if:  Your periods are late, irregular, or painful, or they stop.  You have pelvic pain that does not go away.  You have pressure on your bladder or trouble emptying your bladder completely.  You have pain during sex.  You have any of the following in your abdomen: ? A feeling of fullness. ? Pressure. ? Discomfort. ? Pain that does not go away. ? Swelling.  You feel generally ill.  You become constipated.  You lose your appetite.  You develop severe acne.  You start to have more body hair and facial hair.  You are gaining weight or losing weight without changing your exercise and eating habits.  You think you may be pregnant. Get help right away if:  You have abdominal pain that is severe or gets worse.  You cannot eat or drink without vomiting.  You suddenly develop a fever.  Your menstrual period is much heavier than usual. This information is not intended to replace advice given to you by your health care provider. Make sure you discuss any questions you have with your health care provider. Document Released: 02/07/2005 Document Revised: 05/08/2017 Document Reviewed: 07/12/2015 Elsevier Patient Education  2020 [employer]  

## 2018-10-04 ENCOUNTER — Other Ambulatory Visit: Payer: Managed Care, Other (non HMO)

## 2018-10-05 ENCOUNTER — Ambulatory Visit: Payer: Managed Care, Other (non HMO) | Admitting: Obstetrics and Gynecology

## 2018-10-08 ENCOUNTER — Other Ambulatory Visit: Payer: Self-pay

## 2018-10-08 ENCOUNTER — Other Ambulatory Visit (INDEPENDENT_AMBULATORY_CARE_PROVIDER_SITE_OTHER): Payer: Managed Care, Other (non HMO)

## 2018-10-08 DIAGNOSIS — Z0000 Encounter for general adult medical examination without abnormal findings: Secondary | ICD-10-CM

## 2018-10-08 LAB — COMPREHENSIVE METABOLIC PANEL
ALT: 26 IU/L (ref 0–32)
AST: 26 IU/L (ref 0–40)
Albumin/Globulin Ratio: 1.8 (ref 1.2–2.2)
Albumin: 4.8 g/dL (ref 3.9–5.0)
Alkaline Phosphatase: 80 IU/L (ref 39–117)
BUN/Creatinine Ratio: 11 (ref 9–23)
BUN: 9 mg/dL (ref 6–20)
Bilirubin Total: <0.2 mg/dL (ref 0.0–1.2)
CO2: 24 mmol/L (ref 20–29)
Calcium: 10 mg/dL (ref 8.7–10.2)
Chloride: 103 mmol/L (ref 96–106)
Creatinine, Ser: 0.8 mg/dL (ref 0.57–1.00)
GFR calc Af Amer: 119 mL/min/{1.73_m2} (ref >59)
GFR calc non Af Amer: 104 mL/min/{1.73_m2} (ref >59)
Globulin, Total: 2.6 g/dL (ref 1.5–4.5)
Glucose: 83 mg/dL (ref 65–99)
Potassium: 4.4 mmol/L (ref 3.5–5.2)
Sodium: 139 mmol/L (ref 134–144)
Total Protein: 7.4 g/dL (ref 6.0–8.5)

## 2018-10-08 LAB — LIPID PANEL
Chol/HDL Ratio: 4.3 ratio (ref 0.0–4.4)
Cholesterol, Total: 183 mg/dL (ref 100–199)
HDL: 43 mg/dL (ref >39)
LDL Calculated: 99 mg/dL (ref 0–99)
Triglycerides: 203 mg/dL — ABNORMAL HIGH (ref 0–149)
VLDL Cholesterol Cal: 41 mg/dL — ABNORMAL HIGH (ref 5–40)

## 2018-10-08 LAB — CBC
Hematocrit: 42.1 % (ref 34.0–46.6)
Hemoglobin: 14.5 g/dL (ref 11.1–15.9)
MCH: 32.3 pg (ref 26.6–33.0)
MCHC: 34.4 g/dL (ref 31.5–35.7)
MCV: 94 fL (ref 79–97)
Platelets: 314 10*3/uL (ref 150–450)
RBC: 4.49 x10E6/uL (ref 3.77–5.28)
RDW: 11.4 % — ABNORMAL LOW (ref 11.7–15.4)
WBC: 6.8 10*3/uL (ref 3.4–10.8)

## 2018-10-19 ENCOUNTER — Telehealth: Payer: Self-pay | Admitting: Obstetrics and Gynecology

## 2018-10-19 NOTE — Telephone Encounter (Signed)
Spoke with patient. Patient reports yellow/green vaginal d/c and "possible" fishy odor that started approximately 1 wk ago. Denies itching, pelvic pain, fever/chills, N/V or irregular bleeding. IUD for contraceptive. Patient request OV. [id number] prescreen negative, precautions reviewed. OV scheduled for 8/31 at 4:30pm with Dr. Talbert Nan.   Routing to provider for final review. Patient is agreeable to disposition. Will close encounter.

## 2018-10-19 NOTE — Telephone Encounter (Signed)
Appointment Request From: Pearson Forster    With Provider: Salvadore Dom, MD [facility]]    Preferred Date Range: 10/22/2018 - 10/23/2018    Preferred Times: Monday Morning, Tuesday Morning, Tuesday Afternoon    Reason for visit: Request an Appointment    Comments:  New odor and unusual discharge.

## 2018-10-22 ENCOUNTER — Other Ambulatory Visit: Payer: Self-pay

## 2018-10-22 ENCOUNTER — Ambulatory Visit: Payer: Self-pay | Admitting: Obstetrics and Gynecology

## 2018-10-22 ENCOUNTER — Ambulatory Visit (INDEPENDENT_AMBULATORY_CARE_PROVIDER_SITE_OTHER): Payer: Managed Care, Other (non HMO) | Admitting: Obstetrics and Gynecology

## 2018-10-22 ENCOUNTER — Encounter: Payer: Self-pay | Admitting: Obstetrics and Gynecology

## 2018-10-22 VITALS — BP 98/62 | Ht 62.0 in | Wt 113.8 lb

## 2018-10-22 DIAGNOSIS — B9689 Other specified bacterial agents as the cause of diseases classified elsewhere: Secondary | ICD-10-CM

## 2018-10-22 DIAGNOSIS — F418 Other specified anxiety disorders: Secondary | ICD-10-CM

## 2018-10-22 DIAGNOSIS — Z113 Encounter for screening for infections with a predominantly sexual mode of transmission: Secondary | ICD-10-CM

## 2018-10-22 DIAGNOSIS — N760 Acute vaginitis: Secondary | ICD-10-CM | POA: Diagnosis not present

## 2018-10-22 MED ORDER — CITALOPRAM HYDROBROMIDE 20 MG PO TABS: ORAL_TABLET | ORAL | 1 refills | Status: DC

## 2018-10-22 MED ORDER — METRONIDAZOLE 500 MG PO TABS: 500.0000 mg | ORAL_TABLET | Freq: Two times a day (BID) | ORAL | 0 refills | Status: DC

## 2018-10-22 MED ORDER — BETAMETHASONE VALERATE 0.1 % EX OINT: TOPICAL_OINTMENT | CUTANEOUS | 0 refills | Status: DC

## 2018-10-22 NOTE — Patient Instructions (Addendum)
Major Depressive Disorder, Adult Major depressive disorder (MDD) is a mental health condition. MDD often makes you feel sad, hopeless, or helpless. MDD can also cause symptoms in your body. MDD can affect your:  Work.  School.  Relationships.  Other normal activities. MDD can range from mild to very bad. It may occur once (single episode MDD). It can also occur many times (recurrent MDD). The main symptoms of MDD often include:  Feeling sad, depressed, or irritable most of the time.  Loss of interest. MDD symptoms also include:  Sleeping too much or too little.  Eating too much or too little.  A change in your weight.  Feeling tired (fatigue) or having low energy.  Feeling worthless.  Feeling guilty.  Trouble making decisions.  Trouble thinking clearly.  Thoughts of suicide or harming others.  Feeling weak.  Feeling agitated.  Keeping yourself from being around other people (isolation). Follow these instructions at home: Activity  Do these things as told by your doctor: ? Go back to your normal activities. ? Exercise regularly. ? Spend time outdoors. Alcohol  Talk with your doctor about how alcohol can affect your antidepressant medicines.  Do not drink alcohol. Or, limit how much alcohol you drink. ? This means no more than 1 drink a day for nonpregnant women and 2 drinks a day for men. One drink equals one of these:  12 oz of beer.  5 oz of wine.  1 oz of hard liquor. General instructions  Take over-the-counter and prescription medicines only as told by your doctor.  Eat a healthy diet.  Get plenty of sleep.  Find activities that you enjoy. Make time to do them.  Think about joining a support group. Your doctor may be able to suggest a group for you.  Keep all follow-up visits as told by your doctor. This is important. Where to find more information:  [employer] on Mental Illness: ? www.nami.[place]: ? https://carter.com/  National Suicide Prevention Lifeline: ? 253-512-5797. This is free, 24-hour help. Contact a doctor if:  Your symptoms get worse.  You have new symptoms. Get help right away if:  You self-harm.  You see, hear, taste, smell, or feel things that are not present (hallucinate). If you ever feel like you may hurt yourself or others, or have thoughts about taking your own life, get help right away. You can go to your nearest emergency department or call:  Your local emergency services (911 in the [place]  A suicide crisis helpline, such as the National Suicide Prevention Lifeline: ? 770 607 1957. This is open 24 hours a day. This information is not intended to replace advice given to you by your health care provider. Make sure you discuss any questions you have with your health care provider. Document Released: 01/19/2015 Document Revised: 01/20/2017 Document Reviewed: 10/25/2015 Elsevier Patient Education  2020 [employer]  After being diagnosed with an anxiety disorder, you may be relieved to know why you have felt or behaved a certain way. It is natural to also feel overwhelmed about the treatment ahead and what it will mean for your life. With care and support, you can manage this condition and recover from it. How to cope with anxiety Dealing with stress Stress is your body's reaction to life changes and events, both good and bad. Stress can last just a few hours or it can be ongoing. Stress can play a major role in anxiety, so it  is important to learn both how to cope with stress and how to think about it differently. Talk with your health care provider or a counselor to learn more about stress reduction. He or she may suggest some stress reduction techniques, such as:  Music therapy. This can include creating or listening to music that you enjoy and that inspires you.  Mindfulness-based meditation. This involves being aware of  your normal breaths, rather than trying to control your breathing. It can be done while sitting or walking.  Centering prayer. This is a kind of meditation that involves focusing on a word, phrase, or sacred image that is meaningful to you and that brings you peace.  Deep breathing. To do this, expand your stomach and inhale slowly through your nose. Hold your breath for 3-5 seconds. Then exhale slowly, allowing your stomach muscles to relax.  Self-talk. This is a skill where you identify thought patterns that lead to anxiety reactions and correct those thoughts.  Muscle relaxation. This involves tensing muscles then relaxing them. Choose a stress reduction technique that fits your lifestyle and personality. Stress reduction techniques take time and practice. Set aside 5-15 minutes a day to do them. Therapists can offer training in these techniques. The training may be covered by some insurance plans. Other things you can do to manage stress include:  Keeping a stress diary. This can help you learn what triggers your stress and ways to control your response.  Thinking about how you respond to certain situations. You may not be able to control everything, but you can control your reaction.  Making time for activities that help you relax, and not feeling guilty about spending your time in this way. Therapy combined with coping and stress-reduction skills provides the best chance for successful treatment. Medicines Medicines can help ease symptoms. Medicines for anxiety include:  Anti-anxiety drugs.  Antidepressants.  Beta-blockers. Medicines may be used as the main treatment for anxiety disorder, along with therapy, or if other treatments are not working. Medicines should be prescribed by a health care provider. Relationships Relationships can play a big part in helping you recover. Try to spend more time connecting with trusted friends and family members. Consider going to couples  counseling, taking family education classes, or going to family therapy. Therapy can help you and others better understand the condition. How to recognize changes in your condition Everyone has a different response to treatment for anxiety. Recovery from anxiety happens when symptoms decrease and stop interfering with your daily activities at home or work. This may mean that you will start to:  Have better concentration and focus.  Sleep better.  Be less irritable.  Have more energy.  Have improved memory. It is important to recognize when your condition is getting worse. Contact your health care provider if your symptoms interfere with home or work and you do not feel like your condition is improving. Where to find help and support: You can get help and support from these sources:  Self-help groups.  Online and [employer].  A trusted spiritual leader.  Couples counseling.  Family education classes.  Family therapy. Follow these instructions at home:  Eat a healthy diet that includes plenty of vegetables, fruits, whole grains, low-fat dairy products, and lean protein. Do not eat a lot of foods that are high in solid fats, added sugars, or salt.  Exercise. Most adults should do the following: ? Exercise for at least 150 minutes each week. The exercise should increase your heart  rate and make you sweat (moderate-intensity exercise). ? Strengthening exercises at least twice a week.  Cut down on caffeine, tobacco, alcohol, and other potentially harmful substances.  Get the right amount and quality of sleep. Most adults need 7-9 hours of sleep each night.  Make choices that simplify your life.  Take over-the-counter and prescription medicines only as told by your health care provider.  Avoid caffeine, alcohol, and certain over-the-counter cold medicines. These may make you feel worse. Ask your pharmacist which medicines to avoid.  Keep all follow-up visits as  told by your health care provider. This is important. Questions to ask your health care provider  Would I benefit from therapy?  How often should I follow up with a health care provider?  How long do I need to take medicine?  Are there any long-term side effects of my medicine?  Are there any alternatives to taking medicine? Contact a health care provider if:  You have a hard time staying focused or finishing daily tasks.  You spend many hours a day feeling worried about everyday life.  You become exhausted by worry.  You start to have headaches, feel tense, or have nausea.  You urinate more than normal.  You have diarrhea. Get help right away if:  You have a racing heart and shortness of breath.  You have thoughts of hurting yourself or others. If you ever feel like you may hurt yourself or others, or have thoughts about taking your own life, get help right away. You can go to your nearest emergency department or call:  Your local emergency services (911 in the [place]  A suicide crisis helpline, such as the National Suicide Prevention Lifeline at 208-706-12391-216 383 4647. This is open 24-hours a day. Summary  Taking steps to deal with stress can help calm you.  Medicines cannot cure anxiety disorders, but they can help ease symptoms.  Family, friends, and partners can play a big part in helping you recover from an anxiety disorder. This information is not intended to replace advice given to you by your health care provider. Make sure you discuss any questions you have with your health care provider. Document Released: 02/02/2016 Document Revised: 01/20/2017 Document Reviewed: 02/02/2016 Elsevier Patient Education  2020 [employer] Vaginitis Vaginitis is a condition in which the vaginal tissue swells and becomes red (inflamed). This condition is most often caused by a change in the normal balance of bacteria and yeast that live in the vagina. This change causes an overgrowth of  certain bacteria or yeast, which causes the inflammation. There are different types of vaginitis, but the most common types are:  Bacterial vaginosis.  Yeast infection (candidiasis).  Trichomoniasis vaginitis. This is a sexually transmitted disease (STD).  Viral vaginitis.  Atrophic vaginitis.  Allergic vaginitis. What are the causes? The cause of this condition depends on the type of vaginitis. It can be caused by:  Bacteria (bacterial vaginosis).  Yeast, which is a fungus (yeast infection).  A parasite (trichomoniasis vaginitis).  A virus (viral vaginitis).  Low hormone levels (atrophic vaginitis). Low hormone levels can occur during pregnancy, breastfeeding, or after menopause.  Irritants, such as bubble baths, scented tampons, and feminine sprays (allergic vaginitis). Other factors can change the normal balance of the yeast and bacteria that live in the vagina. These include:  Antibiotic medicines.  Poor hygiene.  Diaphragms, vaginal sponges, spermicides, birth control pills, and intrauterine devices (IUD).  Sex.  Infection.  Uncontrolled diabetes.  A weakened defense (immune) system. What increases  the risk? This condition is more likely to develop in women who:  Smoke.  Use vaginal douches, scented tampons, or scented sanitary pads.  Wear tight-fitting pants.  Wear thong underwear.  Use oral birth control pills or an IUD.  Have sex without a condom.  Have multiple sex partners.  Have an STD.  Frequently use the spermicide nonoxynol-9.  Eat lots of foods high in sugar.  Have uncontrolled diabetes.  Have low estrogen levels.  Have a weakened immune system from an immune disorder or medical treatment.  Are pregnant or breastfeeding. What are the signs or symptoms? Symptoms vary depending on the cause of the vaginitis. Common symptoms include:  Abnormal vaginal discharge. ? The discharge is white, gray, or yellow with bacterial vaginosis.  ? The discharge is thick, white, and cheesy with a yeast infection. ? The discharge is frothy and yellow or greenish with trichomoniasis.  A bad vaginal smell. The smell is fishy with bacterial vaginosis.  Vaginal itching, pain, or swelling.  Sex that is painful.  Pain or burning when urinating. Sometimes there are no symptoms. How is this diagnosed? This condition is diagnosed based on your symptoms and medical history. A physical exam, including a pelvic exam, will also be done. You may also have other tests, including:  Tests to determine the pH level (acidity or alkalinity) of your vagina.  A whiff test, to assess the odor that results when a sample of your vaginal discharge is mixed with a potassium hydroxide solution.  Tests of vaginal fluid. A sample will be examined under a microscope. How is this treated? Treatment varies depending on the type of vaginitis you have. Your treatment may include:  Antibiotic creams or pills to treat bacterial vaginosis and trichomoniasis.  Antifungal medicines, such as vaginal creams or suppositories, to treat a yeast infection.  Medicine to ease discomfort if you have viral vaginitis. Your sexual partner should also be treated.  Estrogen delivered in a cream, pill, suppository, or vaginal ring to treat atrophic vaginitis. If vaginal dryness occurs, lubricants and moisturizing creams may help. You may need to avoid scented soaps, sprays, or douches.  Stopping use of a product that is causing allergic vaginitis. Then using a vaginal cream to treat the symptoms. Follow these instructions at home: Lifestyle  Keep your genital area clean and dry. Avoid soap, and only rinse the area with water.  Do not douche or use tampons until your health care provider says it is okay to do so. Use sanitary pads, if needed.  Do not have sex until your health care provider approves. When you can return to sex, practice safe sex and use condoms.  Wipe from  front to back. This avoids the spread of bacteria from the rectum to the vagina. General instructions  Take over-the-counter and prescription medicines only as told by your health care provider.  If you were prescribed an antibiotic medicine, take or use it as told by your health care provider. Do not stop taking or using the antibiotic even if you start to feel better.  Keep all follow-up visits as told by your health care provider. This is important. How is this prevented?  Use mild, non-scented products. Do not use things that can irritate the vagina, such as fabric softeners. Avoid the following products if they are scented: ? Feminine sprays. ? Detergents. ? Tampons. ? Feminine hygiene products. ? Soaps or bubble baths.  Let air reach your genital area. ? Wear cotton underwear to reduce moisture  buildup. ? Avoid wearing underwear while you sleep. ? Avoid wearing tight pants and underwear or nylons without a cotton panel. ? Avoid wearing thong underwear.  Take off any wet clothing, such as bathing suits, as soon as possible.  Practice safe sex and use condoms. Contact a health care provider if:  You have abdominal pain.  You have a fever.  You have symptoms that last for more than 2-3 days. Get help right away if:  You have a fever and your symptoms suddenly get worse. Summary  Vaginitis is a condition in which the vaginal tissue becomes inflamed.This condition is most often caused by a change in the normal balance of bacteria and yeast that live in the vagina.  Treatment varies depending on the type of vaginitis you have.  Do not douche, use tampons , or have sex until your health care provider approves. When you can return to sex, practice safe sex and use condoms. This information is not intended to replace advice given to you by your health care provider. Make sure you discuss any questions you have with your health care provider. Document Released: 12/05/2006  Document Revised: 01/20/2017 Document Reviewed: 03/15/2016 Elsevier Patient Education  2020 [employer].

## 2018-10-22 NOTE — Progress Notes (Signed)
GYNECOLOGY  VISIT   HPI: 23 y.o.   Married White or Caucasian Not Hispanic or Latino  female   [id number] with No LMP recorded. (Menstrual status: IUD).   here for  Patient states that she started to notice the discharge 10/14/18 she states that it is a yellow green color and has an order.  Slight irritation. No itching.  She and her husband had sex with a third person (female), she is worried about STD's.   She has a mirena IUD inserted in 6/20. Her last cycle lasted 2 weeks.  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 She is down and very anxious. Her husband is supportive.   GYNECOLOGIC HISTORY: No LMP recorded. (Menstrual status: IUD). Contraception IUD Menopausal hormone therapy: none        OB History    Gravida  0   Para  0   Term  0   Preterm  0   AB  0   Living  0     SAB  0   TAB  0   Ectopic  0   Multiple  0   Live Births  0              Patient Active Problem List   Diagnosis Date Noted  . Migraine with aura and without status migrainosus, not intractable 07/10/2018    Past Medical History:  Diagnosis Date  . Anxiety   . Dysmenorrhea   . Migraine with aura     Past Surgical History:  Procedure Laterality Date  . HERNIA REPAIR      Current Outpatient Medications  Medication Sig Dispense Refill  . Ascorbic Acid (VITAMIN C PO) Take 2 tablets by mouth daily.    . betamethasone valerate ointment (VALISONE) 0.1 % Apply a pea sized amount topically bid for 1-2 weeks as needed. Not for daily long term use. 15 g 0  . bismuth subsalicylate (PEPTO BISMOL) 262 MG/15ML suspension Take 30 mLs by mouth every 6 (six) hours as needed for indigestion.    Marland Kitchen. ibuprofen (ADVIL) 800 MG tablet Take 1 tablet (800 mg total) by mouth every 8 (eight) hours as needed. 30 tablet 2  . levonorgestrel (MIRENA) 20 MCG/24HR IUD 1 each by Intrauterine route once.    . ondansetron (ZOFRAN) 4 MG tablet Take  1 tablet (4 mg total) by mouth every 6 (six) hours as needed for nausea or vomiting. 12 tablet 0   No current facility-administered medications for this visit.      ALLERGIES: Patient has no known allergies.  Family History  Problem Relation Age of Onset  . Fibroids Mother     Social History   Socioeconomic History  . Marital status: Married    Spouse name: Not on file  . Number of children: Not on file  . Years of education: Not on file  . Highest education level: Not on file  Occupational History  . Not on file  Social Needs  . Financial resource strain: Not on file  . Food insecurity    Worry: Not on file    Inability: Not on file  . Transportation needs    Medical: Not on file    Non-medical: Not on file  Tobacco Use  . Smoking status: Never Smoker  . Smokeless tobacco: Never Used  Substance and Sexual Activity  . Alcohol use: Yes    Alcohol/week: 3.0 - 4.0 standard drinks  Types: 3 - 4 Standard drinks or equivalent per week  . Drug use: No  . Sexual activity: Yes    Birth control/protection: I.U.D.  Lifestyle  . Physical activity    Days per week: Not on file    Minutes per session: Not on file  . Stress: Not on file  Relationships  . Social Herbalist on phone: Not on file    Gets together: Not on file    Attends religious service: Not on file    Active member of club or organization: Not on file    Attends meetings of clubs or organizations: Not on file    Relationship status: Not on file  . Intimate partner violence    Fear of current or ex partner: Not on file    Emotionally abused: Not on file    Physically abused: Not on file    Forced sexual activity: Not on file  Other Topics Concern  . Not on file  Social History Narrative  . Not on file    Review of Systems  All other systems reviewed and are negative.   PHYSICAL EXAMINATION:    BP 98/62   Ht 5\' 2"  (1.575 m)   Wt 113 lb 12.8 oz (51.6 kg)   BMI 20.81 kg/m     General  appearance: alert, cooperative and appears stated age  Pelvic: External genitalia:  no lesions              Urethra:  normal appearing urethra with no masses, tenderness or lesions              Bartholins and Skenes: normal                 Vagina: normal appearing vagina with normal color and discharge, no lesions              Cervix: no lesions and IUD string 2 cm                Chaperone was present for exam.  Wet prep: ++ clue, no trich, rare wbc KOH: no yeast PH: 5   ASSESSMENT Bacterial vaginitis Screening for STD (declines blood work) Depression and anxiety, situational.    PLAN Treat with flagyl (no ETOH) Send nuswab for GC/CT/Trich Name of counselor given Start Celexa, f/u in one month.  Call with any concerns   An After Visit Summary was printed and given to the patient.  ~25 minutes face to face time of which over 50% was spent in counseling.

## 2018-10-24 LAB — CHLAMYDIA/GONOCOCCUS/TRICHOMONAS, NAA
Chlamydia by NAA: NEGATIVE
Gonococcus by NAA: NEGATIVE
Trich vag by NAA: NEGATIVE

## 2018-11-13 ENCOUNTER — Other Ambulatory Visit: Payer: Self-pay | Admitting: Obstetrics and Gynecology

## 2018-11-13 NOTE — Telephone Encounter (Signed)
She has a f/u appointment on 12/10/18. 30 day supply of celexa sent to her pharmacy

## 2018-11-13 NOTE — Telephone Encounter (Signed)
Medication refill request: Celexa Last AEX:  09/26/2018 JJ Next AEX: none Last MMG (if hormonal medication request): n/a Refill authorized: Pending #[phone] with 3 refills if appropriate. Please advise.

## 2018-11-22 ENCOUNTER — Telehealth: Payer: Self-pay | Admitting: Obstetrics and Gynecology

## 2018-11-22 NOTE — Telephone Encounter (Signed)
Patient is calling regarding Celexa. Patient stated that she takes the medication in the morning and is wondering if it is safe to drink alcohol in the evenings.

## 2018-11-22 NOTE — Telephone Encounter (Signed)
Per review of Up to date, should avoid drinking alcohol while taking citalopram.  Call returned to patient, advised as seen above. Questions answered. Advised Dr. Talbert Nan will review, our office will return call if any additional recommendations.   Routing to provider for final review. Patient is agreeable to disposition. Will close encounter.

## 2018-11-23 ENCOUNTER — Telehealth: Payer: Self-pay | Admitting: Obstetrics and Gynecology

## 2018-11-23 NOTE — Telephone Encounter (Signed)
Combining Celexa and alcohol can enhance the psychomotor impairment of Alcohol. She could have an increase in dizziness, drowsiness, difficulty concentrating. I would recommend she limit her intake of alcohol, and monitor for any side effects.

## 2018-11-23 NOTE — Telephone Encounter (Signed)
Left message to call Jennifer Knight at [phone]. 

## 2018-11-23 NOTE — Telephone Encounter (Signed)
See telephone encounter dated 11/22/2018. Encounter closed.

## 2018-11-23 NOTE — Telephone Encounter (Signed)
Spoke with patient. Advised of message as seen below from Dr.Jertson. Patient verbalizes understanding. Encounter closed. 

## 2018-11-23 NOTE — Telephone Encounter (Signed)
Patient is returning a call to Kaitlyn. °

## 2018-11-28 ENCOUNTER — Telehealth: Payer: Self-pay | Admitting: Obstetrics and Gynecology

## 2018-11-28 DIAGNOSIS — R102 Pelvic and perineal pain: Secondary | ICD-10-CM

## 2018-11-28 NOTE — Progress Notes (Signed)
GYNECOLOGY  VISIT   HPI: 23 y.o.   Married White or Caucasian Not Hispanic or Latino  female   G0P0000 with Patient's last menstrual period was 11/03/2018.   here for pelvic ultrasound for pain after intercourse 5 days. Since then she has had intermittent pelvic cramping. The pain after intercourse was sharp and on the left, lasted <60 seconds, severe. Husband could feel her string. Cramping is getting better, happening a couple of times a day, lasting for less than a minute.  Patient complains of nausea starting yesterday, neck and lower back pain.  Felt lightheaded after walking up 3 flights of stairs yesterday.  The mirena IUD was inserted in 6/20. She is having a monthly cycle for 2 weeks, lighter than prior the IUD. Can wear a panty liner.   GYNECOLOGIC HISTORY: Patient's last menstrual period was 11/03/2018. Contraception:IUD Menopausal hormone therapy: n/a        OB History    Gravida  0   Para  0   Term  0   Preterm  0   AB  0   Living  0     SAB  0   TAB  0   Ectopic  0   Multiple  0   Live Births  0              Patient Active Problem List   Diagnosis Date Noted  . Migraine with aura and without status migrainosus, not intractable 07/10/2018    Past Medical History:  Diagnosis Date  . Anxiety   . Dysmenorrhea   . Migraine with aura     Past Surgical History:  Procedure Laterality Date  . HERNIA REPAIR      Current Outpatient Medications  Medication Sig Dispense Refill  . betamethasone valerate ointment (VALISONE) 0.1 % Apply a pea sized amount topically bid for 1-2 weeks as needed. Not for daily long term use. 15 g 0  . bismuth subsalicylate (PEPTO BISMOL) 262 MG/15ML suspension Take 30 mLs by mouth every 6 (six) hours as needed for indigestion.    . citalopram (CELEXA) 20 MG tablet Take 1 tablet (20 mg total) by mouth daily. Take 1/2 tablet a day for 1 week, if tolerating increase to one tablet a day. 30 tablet 0  . ibuprofen (ADVIL) 800 MG  tablet Take 1 tablet (800 mg total) by mouth every 8 (eight) hours as needed. 30 tablet 2  . levonorgestrel (MIRENA) 20 MCG/24HR IUD 1 each by Intrauterine route once.    . Multiple Vitamin (MULTIVITAMIN) tablet Take 1 tablet by mouth daily.    . ondansetron (ZOFRAN) 4 MG tablet Take 1 tablet (4 mg total) by mouth every 6 (six) hours as needed for nausea or vomiting. 12 tablet 0   No current facility-administered medications for this visit.      ALLERGIES: Patient has no known allergies.  Family History  Problem Relation Age of Onset  . Fibroids Mother     Social History   Socioeconomic History  . Marital status: Married    Spouse name: Not on file  . Number of children: Not on file  . Years of education: Not on file  . Highest education level: Not on file  Occupational History  . Not on file  Social Needs  . Financial resource strain: Not on file  . Food insecurity    Worry: Not on file    Inability: Not on file  . Transportation needs    Medical: Not on  file    Non-medical: Not on file  Tobacco Use  . Smoking status: Never Smoker  . Smokeless tobacco: Never Used  Substance and Sexual Activity  . Alcohol use: Yes    Alcohol/week: 3.0 - 4.0 standard drinks    Types: 3 - 4 Standard drinks or equivalent per week  . Drug use: No  . Sexual activity: Yes    Birth control/protection: I.U.D.  Lifestyle  . Physical activity    Days per week: Not on file    Minutes per session: Not on file  . Stress: Not on file  Relationships  . Social Musicianconnections    Talks on phone: Not on file    Gets together: Not on file    Attends religious service: Not on file    Active member of club or organization: Not on file    Attends meetings of clubs or organizations: Not on file    Relationship status: Not on file  . Intimate partner violence    Fear of current or ex partner: Not on file    Emotionally abused: Not on file    Physically abused: Not on file    Forced sexual activity:  Not on file  Other Topics Concern  . Not on file  Social History Narrative  . Not on file    Review of Systems  Constitutional: Negative.   HENT: Negative.   Eyes: Negative.   Respiratory: Negative.   Cardiovascular: Negative.   Gastrointestinal: Positive for nausea.  Genitourinary: Negative.   Musculoskeletal: Positive for back pain and neck pain.  Skin: Negative.   Neurological: Negative.   Endo/Heme/Allergies: Negative.   Psychiatric/Behavioral: Negative.     PHYSICAL EXAMINATION:    BP (!) 102/56 (BP Location: Right Arm, Patient Position: Sitting, Cuff Size: Normal)   Pulse 76   Temp 97.8 F (36.6 C) (Temporal)   Resp 12   Wt 111 lb 3.2 oz (50.4 kg)   LMP 11/03/2018   BMI 20.34 kg/m     General appearance: alert, cooperative and appears stated age Abdomen: soft, non-tender; non distended, no masses,  no organomegaly CVA: not tender  Pelvic: External genitalia:  no lesions              Urethra:  normal appearing urethra with no masses, tenderness or lesions              Bartholins and Skenes: normal                 Vagina: normal appearing vagina with normal color and discharge, no lesions              Cervix: no cervical motion tenderness, no lesions and IUD string 3 cm              Bimanual Exam:  Uterus:  normal size, contour, position, consistency, mobility, non-tender and retroverted              Adnexa: no masses, mildly tender on the left               Chaperone was present for exam.  Ultrasound images reviewed with the patient. IUD in place, collapsing follicle in the left ovary with some free fluid around the left adnexa.   ASSESSMENT Pelvic pain, suspect from ruptured left ovarian cyst Nausea, light headed, back pain. Suspect viral illness  Breakthrough bleeding with IUD, inserted 4 months ago  PLAN Patient reassured Discussed that her bleeding should continue to improve UPT negative Ibuprofen  prn pain F/U with student health for viral  symptoms.   An After Visit Summary was printed and given to the patient.

## 2018-11-28 NOTE — Telephone Encounter (Signed)
Call to patient. Patient states that she had intercourse on Saturday and afterwards, she was experiencing a sharp pain in her left ovary. Had mirena placed in June. LMP was 11-10-2018, and patient states she has been spotting since period. Patient states the pain comes and goes and she is now having some back pain. Denies urinary symptoms. States she has been checking her temperature hourly and she has not had a fever. Had husband check her strings on Saturday and states he was able to feel the strings. Patient states my uterus just feels "inflammed." RN advised PUS/OV recommended. Reviewed with Dr. Talbert Nan. Patient scheduled for PUS on 11-29-2018 at 1400 with 1430 consult with Dr. Talbert Nan. Future order placed for PUS. Pain/bleeding precautions reviewed with patient and she verbalized understanding.   Routing to provider and will close encounter.   Cc Lerry Liner

## 2018-11-28 NOTE — Telephone Encounter (Signed)
Appointment Request From: Pearson Forster    With Provider: Salvadore Dom, MD [facility]]    Preferred Date Range: 11/30/2018 - 12/03/2018    Preferred Times: Any Time    Reason for visit: Request an Appointment    Comments:  Recurring uterine pain after sex on Saturday. I was having sex with my husband and as I got off I felt a terrible stabbing pain on my left ovary. It subsided quickly but after that I've felt off. Worried the IUD moved or shifted.

## 2018-11-29 ENCOUNTER — Ambulatory Visit (INDEPENDENT_AMBULATORY_CARE_PROVIDER_SITE_OTHER): Payer: Managed Care, Other (non HMO) | Admitting: Obstetrics and Gynecology

## 2018-11-29 ENCOUNTER — Other Ambulatory Visit: Payer: Self-pay

## 2018-11-29 ENCOUNTER — Other Ambulatory Visit: Payer: Managed Care, Other (non HMO)

## 2018-11-29 ENCOUNTER — Encounter: Payer: Self-pay | Admitting: Obstetrics and Gynecology

## 2018-11-29 ENCOUNTER — Ambulatory Visit (INDEPENDENT_AMBULATORY_CARE_PROVIDER_SITE_OTHER): Payer: Managed Care, Other (non HMO)

## 2018-11-29 VITALS — BP 102/56 | HR 76 | Temp 97.8°F | Resp 12 | Wt 111.2 lb

## 2018-11-29 DIAGNOSIS — Z30431 Encounter for routine checking of intrauterine contraceptive device: Secondary | ICD-10-CM

## 2018-11-29 DIAGNOSIS — R42 Dizziness and giddiness: Secondary | ICD-10-CM

## 2018-11-29 DIAGNOSIS — N921 Excessive and frequent menstruation with irregular cycle: Secondary | ICD-10-CM

## 2018-11-29 DIAGNOSIS — R102 Pelvic and perineal pain: Secondary | ICD-10-CM

## 2018-11-29 DIAGNOSIS — Z975 Presence of (intrauterine) contraceptive device: Secondary | ICD-10-CM

## 2018-11-29 DIAGNOSIS — R110 Nausea: Secondary | ICD-10-CM

## 2018-11-29 LAB — POCT URINE PREGNANCY: Preg Test, Ur: NEGATIVE

## 2018-12-05 ENCOUNTER — Other Ambulatory Visit: Payer: Self-pay

## 2018-12-06 NOTE — Progress Notes (Signed)
GYNECOLOGY  VISIT   HPI: 23 y.o.   Married White or Caucasian Not Hispanic or Latino  female   G0P0000 with No LMP recorded. (Menstrual status: IUD).   here for f/u on Celexa. She was started on Celexa on 10/22/18 for anxiety and depression. She reports Celexa is helping with her anxiety, but reports she is having difficulty having an orgasm, has a decreased libido and is feeling more irritable.  Also states that she is having trouble getting restful sleep.  Her depression and anxiety are much better. The side effects are bothersome. 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   GYNECOLOGIC HISTORY: No LMP recorded. (Menstrual status: IUD). Contraception: IUD Menopausal hormone therapy: None        OB History    Gravida  0   Para  0   Term  0   Preterm  0   AB  0   Living  0     SAB  0   TAB  0   Ectopic  0   Multiple  0   Live Births  0              Patient Active Problem List   Diagnosis Date Noted  . Migraine with aura and without status migrainosus, not intractable 07/10/2018    Past Medical History:  Diagnosis Date  . Anxiety   . Dysmenorrhea   . Migraine with aura     Past Surgical History:  Procedure Laterality Date  . HERNIA REPAIR      Current Outpatient Medications  Medication Sig Dispense Refill  . betamethasone valerate ointment (VALISONE) 0.1 % Apply a pea sized amount topically bid for 1-2 weeks as needed. Not for daily long term use. 15 g 0  . bismuth subsalicylate (PEPTO BISMOL) 262 MG/15ML suspension Take 30 mLs by mouth every 6 (six) hours as needed for indigestion.    . citalopram (CELEXA) 20 MG tablet Take 1 tablet (20 mg total) by mouth daily. Take 1/2 tablet a day for 1 week, if tolerating increase to one tablet a day. 30 tablet 0  . ibuprofen (ADVIL) 800 MG tablet Take 1 tablet (800 mg total) by mouth every 8 (eight) hours as needed. 30 tablet 2  . levonorgestrel (MIRENA) 20 MCG/24HR IUD 1 each by Intrauterine route once.     . Multiple Vitamin (MULTIVITAMIN) tablet Take 1 tablet by mouth daily.    . ondansetron (ZOFRAN) 4 MG tablet Take 1 tablet (4 mg total) by mouth every 6 (six) hours as needed for nausea or vomiting. 12 tablet 0   No current facility-administered medications for this visit.      ALLERGIES: Patient has no known allergies.  Family History  Problem Relation Age of Onset  . Fibroids Mother     Social History   Socioeconomic History  . Marital status: Married    Spouse name: Not on file  . Number of children: Not on file  . Years of education: Not on file  . Highest education level: Not on file  Occupational History  . Not on file  Social Needs  . Financial resource strain: Not on file  . Food insecurity    Worry: Not on file    Inability: Not on file  . Transportation needs    Medical: Not on file    Non-medical: Not on file  Tobacco Use  . Smoking status: Never Smoker  . Smokeless tobacco: Never Used  Substance and Sexual Activity  .  Alcohol use: Yes    Alcohol/week: 3.0 - 4.0 standard drinks    Types: 3 - 4 Standard drinks or equivalent per week  . Drug use: No  . Sexual activity: Yes    Birth control/protection: I.U.D.  Lifestyle  . Physical activity    Days per week: Not on file    Minutes per session: Not on file  . Stress: Not on file  Relationships  . Social Musician on phone: Not on file    Gets together: Not on file    Attends religious service: Not on file    Active member of club or organization: Not on file    Attends meetings of clubs or organizations: Not on file    Relationship status: Not on file  . Intimate partner violence    Fear of current or ex partner: Not on file    Emotionally abused: Not on file    Physically abused: Not on file    Forced sexual activity: Not on file  Other Topics Concern  . Not on file  Social History Narrative  . Not on file    Review of Systems  Constitutional: Positive for malaise/fatigue.   HENT: Negative.   Eyes: Negative.   Respiratory: Negative.   Cardiovascular: Negative.   Gastrointestinal: Negative.   Genitourinary:       Difficulty with orgasm  Musculoskeletal: Negative.   Skin: Negative.   Neurological: Negative.   Endo/Heme/Allergies: Negative.   Psychiatric/Behavioral:       Irritability    PHYSICAL EXAMINATION:    BP 120/78 (BP Location: Right Arm, Patient Position: Sitting, Cuff Size: Normal)   Pulse 64   Temp (!) 97.2 F (36.2 C) (Skin)   Wt 112 lb (50.8 kg)   BMI 20.49 kg/m     General appearance: alert, cooperative and appears stated age  ASSESSMENT Depression and anxiety have improved on Celexa, but c/o irritability and sexual dysfunction     PLAN Stop Celexa Start Cymbalta, will start with 1/2 a 60 mg tablet, if tolerating then increase to one tablet a day.  F/U in one month   An After Visit Summary was printed and given to the patient.  ~15 minutes face to face time of which over 50% was spent in counseling.

## 2018-12-07 ENCOUNTER — Other Ambulatory Visit: Payer: Self-pay | Admitting: Obstetrics and Gynecology

## 2018-12-10 ENCOUNTER — Other Ambulatory Visit: Payer: Self-pay

## 2018-12-10 ENCOUNTER — Encounter: Payer: Self-pay | Admitting: Obstetrics and Gynecology

## 2018-12-10 ENCOUNTER — Ambulatory Visit (INDEPENDENT_AMBULATORY_CARE_PROVIDER_SITE_OTHER): Payer: Managed Care, Other (non HMO) | Admitting: Obstetrics and Gynecology

## 2018-12-10 VITALS — BP 120/78 | HR 64 | Temp 97.2°F | Wt 112.0 lb

## 2018-12-10 DIAGNOSIS — T887XXA Unspecified adverse effect of drug or medicament, initial encounter: Secondary | ICD-10-CM | POA: Diagnosis not present

## 2018-12-10 DIAGNOSIS — F418 Other specified anxiety disorders: Secondary | ICD-10-CM | POA: Diagnosis not present

## 2018-12-10 MED ORDER — DULOXETINE HCL 60 MG PO CPEP: ORAL_CAPSULE | ORAL | 1 refills | Status: DC

## 2018-12-11 ENCOUNTER — Telehealth: Payer: Self-pay

## 2018-12-11 MED ORDER — DULOXETINE HCL 30 MG PO CPEP: ORAL_CAPSULE | ORAL | 1 refills | Status: DC

## 2018-12-11 NOTE — Telephone Encounter (Signed)
Please let the patient know that the cymbalta only comes in capsules, I called in the lower dose, if tolerating after a week she can take 2 tablets a day.

## 2018-12-11 NOTE — Telephone Encounter (Signed)
Spoke with patient. Message given as seen below from Dr.Jertson. Patient verbalizes understanding. Encounter closed. 

## 2018-12-11 NOTE — Telephone Encounter (Signed)
[facility] sent a fax stating the Duloxetine is only available in a capsule form and cannot be split in half. Please advise.

## 2019-01-05 ENCOUNTER — Other Ambulatory Visit: Payer: Self-pay | Admitting: Obstetrics and Gynecology

## 2019-01-22 ENCOUNTER — Other Ambulatory Visit: Payer: Self-pay

## 2019-01-22 NOTE — Progress Notes (Signed)
GYNECOLOGY  VISIT   HPI: 23 y.o.   Married White or Caucasian Not Hispanic or Latino  female   G0P0000 with Patient's last menstrual period was 12/24/2018 (approximate).   here for IUD check and to discuss irregular bleeding with IUD. The patient had a mirena IUD placed in 6/20.  Since the IUD was placed in 6/20 she has had monthly cycles. They are progressively getting lighter, bleeding for 2 weeks. For 2-3 days of the bleeding she changes her pad 2 x a day, the rest of the time she can wear a panty liner. Cramps have improved with the IUD. This cycle she is having spotting in between her cycle. She is getting heavier spotting with intercourse.   She is also here to f/u on Cymbalta for depression and anxiety. She was switched from Celexa to Cymbalta in 10/20 secondary to sexual side effects with the Celexa. She is doing well on the Cymbalta, depression and anxiety are controlled.  A friend of hers committed suicide in early 11/20. She feels she was able to handle it better on the medication.   GYNECOLOGIC HISTORY: Patient's last menstrual period was 12/24/2018 (approximate). Contraception: IUD Menopausal hormone therapy: None        OB History    Gravida  0   Para  0   Term  0   Preterm  0   AB  0   Living  0     SAB  0   TAB  0   Ectopic  0   Multiple  0   Live Births  0              Patient Active Problem List   Diagnosis Date Noted  . Migraine with aura and without status migrainosus, not intractable 07/10/2018    Past Medical History:  Diagnosis Date  . Anxiety   . Dysmenorrhea   . Migraine with aura     Past Surgical History:  Procedure Laterality Date  . HERNIA REPAIR      Current Outpatient Medications  Medication Sig Dispense Refill  . betamethasone valerate ointment (VALISONE) 0.1 % Apply a pea sized amount topically bid for 1-2 weeks as needed. Not for daily long term use. 15 g 0  . bismuth subsalicylate (PEPTO BISMOL) 262 MG/15ML suspension  Take 30 mLs by mouth every 6 (six) hours as needed for indigestion.    . DULoxetine (CYMBALTA) 30 MG capsule Take one tablet daily for one week, if tolerating then increase to 2 tablets daily. 60 capsule 1  . ibuprofen (ADVIL) 800 MG tablet Take 1 tablet (800 mg total) by mouth every 8 (eight) hours as needed. 30 tablet 2  . levonorgestrel (MIRENA) 20 MCG/24HR IUD 1 each by Intrauterine route once.    . Multiple Vitamin (MULTIVITAMIN) tablet Take 1 tablet by mouth daily.    . ondansetron (ZOFRAN) 4 MG tablet Take 1 tablet (4 mg total) by mouth every 6 (six) hours as needed for nausea or vomiting. 12 tablet 0   No current facility-administered medications for this visit.      ALLERGIES: Patient has no known allergies.  Family History  Problem Relation Age of Onset  . Fibroids Mother     Social History   Socioeconomic History  . Marital status: Married    Spouse name: Not on file  . Number of children: Not on file  . Years of education: Not on file  . Highest education level: Not on file  Occupational History  .  Not on file  Social Needs  . Financial resource strain: Not on file  . Food insecurity    Worry: Not on file    Inability: Not on file  . Transportation needs    Medical: Not on file    Non-medical: Not on file  Tobacco Use  . Smoking status: Never Smoker  . Smokeless tobacco: Never Used  Substance and Sexual Activity  . Alcohol use: Yes    Alcohol/week: 3.0 - 4.0 standard drinks    Types: 3 - 4 Standard drinks or equivalent per week  . Drug use: No  . Sexual activity: Yes    Birth control/protection: I.U.D.  Lifestyle  . Physical activity    Days per week: Not on file    Minutes per session: Not on file  . Stress: Not on file  Relationships  . Social Herbalist on phone: Not on file    Gets together: Not on file    Attends religious service: Not on file    Active member of club or organization: Not on file    Attends meetings of clubs or  organizations: Not on file    Relationship status: Not on file  . Intimate partner violence    Fear of current or ex partner: Not on file    Emotionally abused: Not on file    Physically abused: Not on file    Forced sexual activity: Not on file  Other Topics Concern  . Not on file  Social History Narrative  . Not on file    Review of Systems  Constitutional: Negative.   HENT: Negative.   Eyes: Negative.   Respiratory: Negative.   Cardiovascular: Negative.   Gastrointestinal: Negative.   Genitourinary:       Irregular bleeding with IUD  Musculoskeletal: Negative.   Skin: Negative.   Neurological: Negative.   Endo/Heme/Allergies: Negative.   Psychiatric/Behavioral: Negative.     PHYSICAL EXAMINATION:    BP 120/70 (BP Location: Right Arm, Patient Position: Sitting, Cuff Size: Normal)   Pulse 88   Temp (!) 97.3 F (36.3 C) (Skin)   Wt 110 lb 3.2 oz (50 kg)   LMP 12/24/2018 (Approximate)   BMI 20.16 kg/m     General appearance: alert, cooperative and appears stated age Neck: no adenopathy, supple, symmetrical, trachea midline and thyroid normal to inspection and palpation Abdomen: soft, non-tender; non distended, no masses,  no organomegaly  Pelvic: External genitalia:  no lesions              Urethra:  normal appearing urethra with no masses, tenderness or lesions              Bartholins and Skenes: normal                 Vagina: normal appearing vagina with normal color and discharge, no lesions              Cervix: no cervical motion tenderness, no lesions and IUD string 3-4 cm              Bimanual Exam:  Uterus:  normal size, contour, position, consistency, mobility, non-tender and retroverted              Adnexa: no mass, fullness, tenderness                Chaperone was present for exam.  ASSESSMENT Long light cycles 6 months after IUD insertion, this month having intermenstrual spotting. Normal exam  H/O depression/anxiety, well controlled with cymbalta      PLAN Anaprox DS, 1 po BID x 4 days to try and stop the spotting If her prolonged bleeding continues, she will call to set up an ultrasound Continue Cymbalta, 60 mg daily F/U for annual exam in 8/21   An After Visit Summary was printed and given to the patient.

## 2019-01-23 ENCOUNTER — Ambulatory Visit (INDEPENDENT_AMBULATORY_CARE_PROVIDER_SITE_OTHER): Payer: Managed Care, Other (non HMO) | Admitting: Obstetrics and Gynecology

## 2019-01-23 ENCOUNTER — Encounter: Payer: Self-pay | Admitting: Obstetrics and Gynecology

## 2019-01-23 VITALS — BP 120/70 | HR 88 | Temp 97.3°F | Wt 110.2 lb

## 2019-01-23 DIAGNOSIS — Z30431 Encounter for routine checking of intrauterine contraceptive device: Secondary | ICD-10-CM | POA: Diagnosis not present

## 2019-01-23 DIAGNOSIS — N921 Excessive and frequent menstruation with irregular cycle: Secondary | ICD-10-CM

## 2019-01-23 DIAGNOSIS — Z975 Presence of (intrauterine) contraceptive device: Secondary | ICD-10-CM | POA: Diagnosis not present

## 2019-01-23 DIAGNOSIS — F418 Other specified anxiety disorders: Secondary | ICD-10-CM | POA: Diagnosis not present

## 2019-01-23 MED ORDER — NAPROXEN SODIUM 550 MG PO TABS: ORAL_TABLET | ORAL | 1 refills | Status: AC

## 2019-01-23 MED ORDER — DULOXETINE HCL 60 MG PO CPEP: 60.0000 mg | ORAL_CAPSULE | Freq: Every day | ORAL | 2 refills | Status: DC

## 2019-02-06 ENCOUNTER — Other Ambulatory Visit: Payer: Self-pay | Admitting: Obstetrics and Gynecology

## 2019-06-11 DIAGNOSIS — N6332 Unspecified lump in axillary tail of the left breast: Secondary | ICD-10-CM | POA: Diagnosis not present

## 2019-06-27 ENCOUNTER — Telehealth: Payer: Self-pay | Admitting: Obstetrics and Gynecology

## 2019-06-27 NOTE — Telephone Encounter (Signed)
Please let the patient know that I don't think her symptoms are related to her IUD.

## 2019-06-27 NOTE — Telephone Encounter (Signed)
Call returned to patient, advised per Dr. Oscar La. Patient verbalizes understanding and is agreeable.   Encounter closed.

## 2019-06-27 NOTE — Telephone Encounter (Signed)
Patient returned call

## 2019-06-27 NOTE — Telephone Encounter (Signed)
Appointment Request From: Gaye Alken    With Provider: Romualdo Bolk, MD [facility]]    Preferred Date Range: 06/27/2019 - 07/01/2019    Preferred Times: Any Time    Reason for visit: Request an Appointment    Comments:  Possible IUD infection.

## 2019-06-27 NOTE — Telephone Encounter (Signed)
Spoke with patient. Patient reports a lump in axilla and night sweats, has been present for 3-4 wks, started after receiving 1st Covid 19 vaccine on 05/16/19. Recived 2nd on 06/06/19. Patient reports she is being followed by PCP, has a f/u appt scheduled for 5/7. Is unsure if symptoms related to IUD, "just going through my options".   Mirena IUD placed 07/2018. Denies irregular bleeding, pelvic pain, fever/chills, vaginal d/c, odor, itching, N/V. No partner changes or STD concerns.   Recommended patient continue f/u with PCP, if f/u needed with Dr. Oscar La, return call to office to schedule. Also offered to schedule OV with Dr. Oscar La, patient declined, will return call if needed. Advised patient I will review with Dr. Oscar La and return call if any additional recommendations. Patient verbalizes understanding and is thankful for call.   Routing to provider for final review. Patient is agreeable to disposition. Will close encounter.

## 2019-06-27 NOTE — Telephone Encounter (Signed)
Left message to call Noreene Larsson, RN at [facility] [phone].   Mirena IUD placed 07/2018

## 2019-06-28 DIAGNOSIS — R590 Localized enlarged lymph nodes: Secondary | ICD-10-CM | POA: Diagnosis not present

## 2019-07-01 ENCOUNTER — Other Ambulatory Visit: Payer: Self-pay | Admitting: Physician Assistant

## 2019-07-01 DIAGNOSIS — R590 Localized enlarged lymph nodes: Secondary | ICD-10-CM

## 2019-07-12 ENCOUNTER — Other Ambulatory Visit: Payer: Self-pay

## 2019-07-12 ENCOUNTER — Other Ambulatory Visit: Payer: Self-pay | Admitting: Physician Assistant

## 2019-07-12 ENCOUNTER — Ambulatory Visit
Admission: RE | Admit: 2019-07-12 | Discharge: 2019-07-12 | Disposition: A | Discharge status: Home / Self Care | Payer: Self-pay | Source: Ambulatory Visit | Attending: Physician Assistant | Admitting: Physician Assistant

## 2019-07-12 DIAGNOSIS — R590 Localized enlarged lymph nodes: Secondary | ICD-10-CM

## 2019-07-12 DIAGNOSIS — R2232 Localized swelling, mass and lump, left upper limb: Secondary | ICD-10-CM | POA: Diagnosis not present

## 2019-07-28 DIAGNOSIS — Z20822 Contact with and (suspected) exposure to covid-19: Secondary | ICD-10-CM | POA: Diagnosis not present

## 2019-08-05 ENCOUNTER — Encounter: Payer: Self-pay | Admitting: Osteopathic Medicine

## 2019-08-05 ENCOUNTER — Ambulatory Visit (INDEPENDENT_AMBULATORY_CARE_PROVIDER_SITE_OTHER): Payer: BC Managed Care – PPO | Admitting: Osteopathic Medicine

## 2019-08-05 ENCOUNTER — Other Ambulatory Visit: Payer: Self-pay

## 2019-08-05 ENCOUNTER — Ambulatory Visit (INDEPENDENT_AMBULATORY_CARE_PROVIDER_SITE_OTHER): Payer: BC Managed Care – PPO

## 2019-08-05 VITALS — BP 103/68 | HR 90 | Temp 97.0°F | Ht 62.0 in | Wt 114.0 lb

## 2019-08-05 DIAGNOSIS — E049 Nontoxic goiter, unspecified: Secondary | ICD-10-CM

## 2019-08-05 DIAGNOSIS — E280 Estrogen excess: Secondary | ICD-10-CM

## 2019-08-05 DIAGNOSIS — Z8349 Family history of other endocrine, nutritional and metabolic diseases: Secondary | ICD-10-CM | POA: Diagnosis not present

## 2019-08-05 DIAGNOSIS — R5382 Chronic fatigue, unspecified: Secondary | ICD-10-CM

## 2019-08-05 DIAGNOSIS — Z8659 Personal history of other mental and behavioral disorders: Secondary | ICD-10-CM | POA: Diagnosis not present

## 2019-08-05 DIAGNOSIS — Z975 Presence of (intrauterine) contraceptive device: Secondary | ICD-10-CM

## 2019-08-05 DIAGNOSIS — E010 Iodine-deficiency related diffuse (endemic) goiter: Secondary | ICD-10-CM | POA: Diagnosis not present

## 2019-08-05 DIAGNOSIS — N926 Irregular menstruation, unspecified: Secondary | ICD-10-CM

## 2019-08-05 DIAGNOSIS — R61 Generalized hyperhidrosis: Secondary | ICD-10-CM

## 2019-08-05 NOTE — Progress Notes (Signed)
Jennifer Knight is a 24 y.o. female who presents to  [facility] at [facility]  today, 08/05/19, seeking care for the following: . New to establish . Sleepiness/fatigue, night sweats, lack of attention/focus. Hx depression/anxiety not currently troubling her, stable on Cymbalta (Hx anxiety/depression, previously on Celexa stopped d/t sexual AE, started on Cymbalta 11/2018 by OBGYN).  Ongoing  . IUD in place, Mirena, periods irregular   Complete ROS otherwise negative except as above  . OBGYN did basic labs 09/2018 no concerns on CBC, CMP, lipids      ASSESSMENT & PLAN with other pertinent history/findings:  The primary encounter diagnosis was Chronic fatigue. Diagnoses of Family history of hypothyroidism, Enlarged thyroid gland, History of depression, Night sweat, Irregular periods, and IUD (intrauterine device) in place were also pertinent to this visit.   Patient Instructions  Labs today Ultrasound thyroid Await lab results - will call / message you!       Orders Placed This Encounter  Procedures  . US THYROID  . CBC  . COMPLETE METABOLIC PANEL WITH GFR  . TSH  . Vitamin B12  . VITAMIN D 25 Hydroxy (Vit-D Deficiency, Fractures)  . Urinalysis, Routine w reflex microscopic  . Follicle stimulating hormone  . Luteinizing hormone  . Estradiol  . CK  . High sensitivity CRP  . Rheumatoid factor  . Sedimentation rate  . ANA    Detailed physical exam normal except mildly enlarged thyroid w/o nodule     Follow-up instructions: Return for RECHECK PENDING RESULTS / IF WORSE OR CHANGE.                                         BP 103/68 (BP Location: Left Arm, Patient Position: Sitting)   Pulse 90   Temp (!) 97 F (36.1 C)   Ht 5\' 2"  (1.575 m)   Wt 114 lb (51.7 kg)   SpO2 100%   BMI 20.85 kg/m   Current Meds  Medication Sig  . levonorgestrel (MIRENA) 20 MCG/24HR IUD 1 each by  Intrauterine route once.  . Multiple Vitamin (MULTIVITAMIN) tablet Take 1 tablet by mouth daily.    No results found for this or any previous visit (from the past 72 hour(s)).  No results found.  Depression screen [facility] 2/9 08/05/2019  Decreased Interest 0  Down, Depressed, Hopeless 0  PHQ - 2 Score 0  Altered sleeping 3  Tired, decreased energy 3  Change in appetite 0  Feeling bad or failure about yourself  0  Trouble concentrating 3  Moving slowly or fidgety/restless 0  Suicidal thoughts 0  PHQ-9 Score 9  Difficult doing work/chores Extremely dIfficult    GAD 7 : Generalized Anxiety Score 08/05/2019  Nervous, Anxious, on Edge 0  Control/stop worrying 2  Worry too much - different things 2  Trouble relaxing 2  Restless 1  Easily annoyed or irritable 1  Afraid - awful might happen 0  Total GAD 7 Score 8  Anxiety Difficulty Very difficult      All questions at time of visit were answered - patient instructed to contact office with any additional concerns or updates.  ER/RTC precautions were reviewed with the patient.  Please note: voice recognition software was used to produce this document, and typos may escape review. Please contact Dr. 08/07/2019 for any needed clarifications.

## 2019-08-05 NOTE — Patient Instructions (Signed)
Labs today Ultrasound thyroid Await lab results - will call / message you!

## 2019-08-07 LAB — CBC
HCT: 39.1 % (ref 35.0–45.0)
Hemoglobin: 13.6 g/dL (ref 11.7–15.5)
MCH: 32.2 pg (ref 27.0–33.0)
MCHC: 34.8 g/dL (ref 32.0–36.0)
MCV: 92.4 fL (ref 80.0–100.0)
MPV: 9.3 fL (ref 7.5–12.5)
Platelets: 261 10*3/uL (ref 140–400)
RBC: 4.23 10*6/uL (ref 3.80–5.10)
RDW: 11.4 % (ref 11.0–15.0)
WBC: 9.2 10*3/uL (ref 3.8–10.8)

## 2019-08-07 LAB — TSH: TSH: 1.07 mIU/L

## 2019-08-07 LAB — VITAMIN B12: Vitamin B-12: 562 pg/mL (ref 200–1100)

## 2019-08-07 LAB — COMPLETE METABOLIC PANEL WITH GFR
AG Ratio: 1.7 (calc) (ref 1.0–2.5)
ALT: 13 U/L (ref 6–29)
AST: 18 U/L (ref 10–30)
Albumin: 4.5 g/dL (ref 3.6–5.1)
Alkaline phosphatase (APISO): 80 U/L (ref 31–125)
BUN: 8 mg/dL (ref 7–25)
CO2: 29 mmol/L (ref 20–32)
Calcium: 9.6 mg/dL (ref 8.6–10.2)
Chloride: 102 mmol/L (ref 98–110)
Creat: 0.71 mg/dL (ref 0.50–1.10)
GFR, Est African American: 137 mL/min/{1.73_m2} (ref >=60)
GFR, Est Non African American: 118 mL/min/{1.73_m2} (ref >=60)
Globulin: 2.7 g/dL (calc) (ref 1.9–3.7)
Glucose, Bld: 86 mg/dL (ref 65–99)
Potassium: 4.1 mmol/L (ref 3.5–5.3)
Sodium: 138 mmol/L (ref 135–146)
Total Bilirubin: 0.5 mg/dL (ref 0.2–1.2)
Total Protein: 7.2 g/dL (ref 6.1–8.1)

## 2019-08-07 LAB — URINALYSIS, ROUTINE W REFLEX MICROSCOPIC
Bilirubin Urine: NEGATIVE
Glucose, UA: NEGATIVE
Hgb urine dipstick: NEGATIVE
Ketones, ur: NEGATIVE
Leukocytes,Ua: NEGATIVE
Nitrite: NEGATIVE
Protein, ur: NEGATIVE
Specific Gravity, Urine: 1.014 (ref 1.001–1.03)
pH: 7.5 (ref 5.0–8.0)

## 2019-08-07 LAB — RHEUMATOID FACTOR: Rheumatoid fact SerPl-aCnc: <14 IU/mL (ref <14)

## 2019-08-07 LAB — LUTEINIZING HORMONE: LH: 2.5 m[IU]/mL

## 2019-08-07 LAB — FOLLICLE STIMULATING HORMONE: FSH: 3 m[IU]/mL

## 2019-08-07 LAB — ANTI-NUCLEAR AB-TITER (ANA TITER): ANA Titer 1: 1:80 {titer} — ABNORMAL HIGH

## 2019-08-07 LAB — VITAMIN D 25 HYDROXY (VIT D DEFICIENCY, FRACTURES): Vit D, 25-Hydroxy: 36 ng/mL (ref 30–100)

## 2019-08-07 LAB — HIGH SENSITIVITY CRP: hs-CRP: 0.4 mg/L

## 2019-08-07 LAB — CK: Total CK: 61 U/L (ref 29–143)

## 2019-08-07 LAB — SEDIMENTATION RATE: Sed Rate: 11 mm/h (ref 0–20)

## 2019-08-07 LAB — ANA: Anti Nuclear Antibody (ANA): POSITIVE — AB

## 2019-08-07 LAB — ESTRADIOL: Estradiol: 606 pg/mL — ABNORMAL HIGH

## 2019-08-08 NOTE — Addendum Note (Signed)
Addended by: Deirdre Pippins on: 08/08/2019 10:06 PM   Modules accepted: Orders

## 2019-08-09 ENCOUNTER — Other Ambulatory Visit: Payer: Self-pay

## 2019-08-09 ENCOUNTER — Ambulatory Visit (INDEPENDENT_AMBULATORY_CARE_PROVIDER_SITE_OTHER): Payer: BC Managed Care – PPO

## 2019-08-09 ENCOUNTER — Ambulatory Visit (INDEPENDENT_AMBULATORY_CARE_PROVIDER_SITE_OTHER): Payer: BC Managed Care – PPO | Admitting: Physician Assistant

## 2019-08-09 VITALS — BP 120/71 | HR 84 | Ht 62.0 in | Wt 116.0 lb

## 2019-08-09 DIAGNOSIS — R61 Generalized hyperhidrosis: Secondary | ICD-10-CM | POA: Diagnosis not present

## 2019-08-09 DIAGNOSIS — R21 Rash and other nonspecific skin eruption: Secondary | ICD-10-CM

## 2019-08-09 DIAGNOSIS — N926 Irregular menstruation, unspecified: Secondary | ICD-10-CM | POA: Diagnosis not present

## 2019-08-09 DIAGNOSIS — E280 Estrogen excess: Secondary | ICD-10-CM

## 2019-08-09 DIAGNOSIS — Z975 Presence of (intrauterine) contraceptive device: Secondary | ICD-10-CM | POA: Diagnosis not present

## 2019-08-09 DIAGNOSIS — N83201 Unspecified ovarian cyst, right side: Secondary | ICD-10-CM | POA: Diagnosis not present

## 2019-08-09 MED ORDER — KETOCONAZOLE 2 % EX CREA: 1.0000 "application " | TOPICAL_CREAM | Freq: Two times a day (BID) | CUTANEOUS | 1 refills | Status: AC

## 2019-08-09 NOTE — Patient Instructions (Signed)
Tinea Versicolor  Tinea versicolor is a common fungal infection of the skin. It causes a rash that appears as light or dark patches on the skin. The rash most often occurs on the chest, back, neck, or upper arms. This condition is more common during warm weather. Other than affecting how your skin looks, tinea versicolor usually does not cause other problems. In most cases, the infection goes away in a few weeks with treatment. It may take a few months for the patches on your skin to return to your usual skin color. What are the causes? This condition occurs when a type of fungus that is normally present on the skin starts to overgrow. This fungus is a kind of yeast. The exact cause of the overgrowth is not known. This condition cannot be passed from one person to another (it is not contagious). What increases the risk? This condition is more likely to develop when certain factors are present, such as:  Heat and humidity.  Sweating too much.  Hormone changes.  Oily skin.  A weak disease-fighting system (immunesystem). What are the signs or symptoms? Symptoms of this condition include:  A rash of light or dark patches on your skin. The rash may have: ? Patches of tan or pink spots (on light skin). ? Patches of white or brown spots (on dark skin). ? Patches of skin that do not tan. ? Well-marked edges. ? Scales on the discolored areas.  Mild itching. How is this diagnosed? A health care provider can usually diagnose this condition by looking at your skin. During the exam, he or she may use ultraviolet (UV) light to see how much of your skin has been affected. In some cases, a skin sample may be taken by scraping the rash. This sample will be viewed under a microscope to check for yeast overgrowth. How is this treated? Treatment for this condition may include:  Dandruff shampoo that is applied to the affected skin during showers or bathing.  Over-the-counter medicated skin cream,  lotion, or soaps.  Prescription antifungal medicine in the form of skin cream or pills.  Medicine to help reduce itching. Follow these instructions at home:  Take over-the-counter and prescription medicines only as told by your health care provider.  Apply dandruff shampoo to the affected area if your health care provider told you to do that. You may be instructed to scrub the affected skin for several minutes each day.  Do not scratch the affected area of skin.  Avoid hot and humid conditions.  Do not use tanning booths.  Try to avoid sweating a lot. Contact a health care provider if:  Your symptoms get worse.  You have a fever.  You have redness, swelling, or pain at the site of your rash.  You have fluid or blood coming from your rash.  Your rash feels warm to the touch.  You have pus or a bad smell coming from your rash.  Your rash returns (recurs) after treatment. Summary  Tinea versicolor is a common fungal infection of the skin. It causes a rash that appears as light or dark patches on the skin.  The rash most often occurs on the chest, back, neck, or upper arms.  A health care provider can usually diagnose this condition by looking at your skin.  Treatment may include applying shampoo to the skin and taking or applying medicines. This information is not intended to replace advice given to you by your health care provider. Make sure you   discuss any questions you have with your health care provider. Document Revised: 01/20/2017 Document Reviewed: 10/11/2016 Elsevier Patient Education  2020 Elsevier Inc.  

## 2019-08-09 NOTE — Progress Notes (Addendum)
   Subjective:    Patient ID: Jennifer Knight, female    DOB: 1995-10-23, 24 y.o.   MRN: [id number]  HPI  Patient is a 24 year old female presenting for a rash under both axilla. She states the rash started this morning under both arms. She states she has had something similar in the past on one of her thighs that lasted several months. She states for the rash on her thigh she used OTC fungal or steroid cream, she cannot recall. She states it eventually resolved but has left scarring. She denies any pain, states it only itches. She denies any new exposures to soaps, deodorants, or soaps. She denies excessive sweating.   ... Active Ambulatory Problems    Diagnosis Date Noted  . Migraine with aura and without status migrainosus, not intractable 07/10/2018   Resolved Ambulatory Problems    Diagnosis Date Noted  . No Resolved Ambulatory Problems   Past Medical History:  Diagnosis Date  . Anxiety   . Dysmenorrhea   . Migraine with aura       Review of Systems  Constitutional: Negative.   HENT: Negative.   Eyes: Negative.   Respiratory: Negative.   Cardiovascular: Negative.   Gastrointestinal: Negative.   Musculoskeletal: Negative.   Skin: Positive for color change and rash (pruitic ).  Neurological: Negative.          Objective:   Physical Exam Constitutional:      Appearance: Normal appearance. She is normal weight.  HENT:     Head: Normocephalic and atraumatic.  Cardiovascular:     Pulses: Normal pulses.  Pulmonary:     Effort: Pulmonary effort is normal.  Skin:    Findings: Rash (Present under both axilla. Areas of hypopigmentation on back. ) present. Rash is macular.              Assessment & Plan:  Marland KitchenMarland KitchenCamira was seen today for rash.  Diagnoses and all orders for this visit:  Rash and nonspecific skin eruption -     ketoconazole (NIZORAL) 2 % cream; Apply 1 application topically 2 (two) times daily. To affected areas for 2-6 weeks. -     Cancel:  Surgical pathology -     Fungal stain   Unclear etiology but appeared more like tinea vesicolor. Appearance of some hypopigmentation where rash was on her legs and abdomen.  - Skin scraping preformed. - discussed cause of tinea versicolor and potential cyclical nature of the skin condition - Ketoconazole 2% cream prescribed. Use twice a day to affected areas for 2-6 weeks.   Handout given. Patient to return if rash does not resolve or worsens. Will trial steroid cream if Ketoconazole does not help.   Marland KitchenHarlon Flor PA-C, have reviewed and agree with the above documentation in it's entirety.

## 2019-08-12 ENCOUNTER — Encounter: Payer: Self-pay | Admitting: Physician Assistant

## 2019-08-12 LAB — FUNGAL STAIN
FUNGAL SMEAR:: NONE SEEN
MICRO NUMBER:: 10608259
SPECIMEN QUALITY:: ADEQUATE

## 2019-08-12 NOTE — Progress Notes (Signed)
No fungus seen on scraping. If not getting any better I would try adding some steroid to it.

## 2019-08-14 NOTE — Addendum Note (Signed)
Addended by: Deirdre Pippins on: 08/14/2019 05:18 PM   Modules accepted: Orders

## 2019-08-15 ENCOUNTER — Telehealth: Payer: Self-pay | Admitting: Obstetrics and Gynecology

## 2019-08-15 NOTE — Telephone Encounter (Signed)
Appointment Request From: Jennifer Knight  With Provider: Romualdo Bolk, MD [facility]]  Preferred Date Range: 08/15/2019 - 08/16/2019  Preferred Times: Any Time  Reason for visit: Office Visit  Comments: I have been dealing with chronic fatigue and night sweats for the past three months. I've also been having a hard time focusing and staying on task. I recent went to my primary care and my estradiol level was 606. We did a normal ultrasound and a transvaginal ultrasound and everything is normal. I need help with my current symptoms of fatigue, night sweats, and anxiety.

## 2019-08-15 NOTE — Telephone Encounter (Signed)
Estradiol 606 =08/05/19 FSH 3.0= 08/05/19 LH 2.5= 08/05/19 PUS, normal with 0.5 cm right ovarian cyst. 08/09/19 Thyroid US 08/05/19, normal. Had d/t enlargement on exam by PCP.  LMP 08/12/19 Mirena IUD placed 08/2018  Spoke with pt. Pt sent Mychart message today.  Pt states having chronic fatigue, night sweats for last 3 months and chronic joint pain in knees since she was 24 years old. Pt states its affecting her daily activities and has her concerned. Pt states was seen by PCP Dr Lyn Hollingshead recently and had some labs drawn and PUS.  Pt states eats healthy diet, exercises, drinks water, decreased alcohol and does not drink caffeine. Pt on Cymbalta, but feels like she has decreased focus lately as well.  Pt states was told has possible false positive Lupus testing. ANA titer- 1:80 Pt states is planning to have children in future in about 4 years. She is SA with husband. Pt is worried and concerned and would like to see Dr Oscar La for resolution of sx.  Pt scheduled with Dr Oscar La as work-in appt on 08/19/19 at 4:15 pm. Pt agreeable to date and time of appt. Will return call to pt with any further recommendations or advice. Pt agreeable.   Routing to Dr Oscar La.  Encounter closed.

## 2019-08-15 NOTE — Telephone Encounter (Signed)
Patient returned a call to Stephanie. 

## 2019-08-15 NOTE — Telephone Encounter (Signed)
Left message for pt to return call to triage RN. 

## 2019-08-19 ENCOUNTER — Other Ambulatory Visit: Payer: Self-pay

## 2019-08-19 ENCOUNTER — Ambulatory Visit (INDEPENDENT_AMBULATORY_CARE_PROVIDER_SITE_OTHER): Payer: BC Managed Care – PPO | Admitting: Obstetrics and Gynecology

## 2019-08-19 ENCOUNTER — Encounter: Payer: Self-pay | Admitting: Obstetrics and Gynecology

## 2019-08-19 VITALS — BP 106/60 | HR 84 | Resp 12 | Ht 62.0 in | Wt 114.0 lb

## 2019-08-19 DIAGNOSIS — M79622 Pain in left upper arm: Secondary | ICD-10-CM

## 2019-08-19 DIAGNOSIS — R5383 Other fatigue: Secondary | ICD-10-CM

## 2019-08-19 DIAGNOSIS — R61 Generalized hyperhidrosis: Secondary | ICD-10-CM | POA: Diagnosis not present

## 2019-08-19 DIAGNOSIS — E280 Estrogen excess: Secondary | ICD-10-CM | POA: Diagnosis not present

## 2019-08-19 DIAGNOSIS — M2550 Pain in unspecified joint: Secondary | ICD-10-CM

## 2019-08-19 NOTE — Progress Notes (Signed)
GYNECOLOGY  VISIT   HPI: 24 y.o.   Married White or Caucasian Not Hispanic or Latino  female   [id number] with Patient's last menstrual period was 08/12/2019.   here for hormone f/u. Patient complains of having chronic fatigue and night sweats. Symptoms started in April. Per patient, has lump under left armpit. These symptoms, per patient, occurred after receiving first COVID-19 vaccine. First covid vaccination was in March, last covid vaccination was in April. She does have a lump in her left axilla, has gotten smaller, but still there.  Her primary recently did lab work which showed a normal FSH, LH but an elevated estradiol of 606. No exogenous estrogen.  No nipple tenderness. Occasional nausea at night.  She has a mirena, placed last year. She has monthly cycle. Still with bad cramps, but better than prior to the IUD. Bleeds for 4-5 days, changes her pad 2-3 x a day.   GYNECOLOGIC HISTORY: Patient's last menstrual period was 08/12/2019. Contraception:Mirena Menopausal hormone therapy: n/a        OB History    Gravida  0   Para  0   Term  0   Preterm  0   AB  0   Living  0     SAB  0   TAB  0   Ectopic  0   Multiple  0   Live Births  0              Patient Active Problem List   Diagnosis Date Noted  . Migraine with aura and without status migrainosus, not intractable 07/10/2018    Past Medical History:  Diagnosis Date  . Anxiety   . Dysmenorrhea   . Migraine with aura     Past Surgical History:  Procedure Laterality Date  . HERNIA REPAIR      Current Outpatient Medications  Medication Sig Dispense Refill  . DULoxetine (CYMBALTA) 60 MG capsule Take 1 capsule (60 mg total) by mouth daily. 90 capsule 2  . ketoconazole (NIZORAL) 2 % cream Apply 1 application topically 2 (two) times daily. To affected areas for 2-6 weeks. 60 g 1  . levonorgestrel (MIRENA) 20 MCG/24HR IUD 1 each by Intrauterine route once.    . Multiple Vitamin (MULTIVITAMIN) tablet Take 1  tablet by mouth daily.    . naproxen sodium (ANAPROX DS) 550 MG tablet Take one tablet BID for up to 4 days if you are having prolonged bleeding 30 tablet 1   No current facility-administered medications for this visit.     ALLERGIES: Patient has no known allergies.  Family History  Problem Relation Age of Onset  . Fibroids Mother     Social History   Socioeconomic History  . Marital status: Married    Spouse name: Not on file  . Number of children: Not on file  . Years of education: Not on file  . Highest education level: Not on file  Occupational History  . Not on file  Tobacco Use  . Smoking status: Never Smoker  . Smokeless tobacco: Never Used  Vaping Use  . Vaping Use: Never used  Substance and Sexual Activity  . Alcohol use: Yes    Alcohol/week: 3.0 - 4.0 standard drinks    Types: 3 - 4 Standard drinks or equivalent per week  . Drug use: No  . Sexual activity: Yes    Birth control/protection: I.U.D.  Other Topics Concern  . Not on file  Social History Narrative  . Not on  file   Social Determinants of Health   Financial Resource Strain:   . Difficulty of Paying Living Expenses:   Food Insecurity:   . Worried About Programme researcher, broadcasting/film/video in the Last Year:   . Barista in the Last Year:   Transportation Needs:   . Freight forwarder (Medical):   Marland Kitchen Lack of Transportation (Non-Medical):   Physical Activity:   . Days of Exercise per Week:   . Minutes of Exercise per Session:   Stress:   . Feeling of Stress :   Social Connections:   . Frequency of Communication with Friends and Family:   . Frequency of Social Gatherings with Friends and Family:   . Attends Religious Services:   . Active Member of Clubs or Organizations:   . Attends Banker Meetings:   Marland Kitchen Marital Status:   Intimate Partner Violence:   . Fear of Current or Ex-Partner:   . Emotionally Abused:   Marland Kitchen Physically Abused:   . Sexually Abused:     Review of Systems   Constitutional: Positive for malaise/fatigue.       Night sweats  HENT: Negative.   Eyes: Negative.   Respiratory: Negative.   Cardiovascular: Negative.   Gastrointestinal: Negative.   Genitourinary: Negative.   Musculoskeletal: Negative.   Skin: Negative.   Neurological: Negative.   Endo/Heme/Allergies: Negative.   Psychiatric/Behavioral: Negative.     PHYSICAL EXAMINATION:    BP 106/60 (BP Location: Right Arm, Patient Position: Sitting, Cuff Size: Normal)   Pulse 84   Resp 12   Ht 5\' 2"  (1.575 m)   Wt 114 lb (51.7 kg)   LMP 08/12/2019   BMI 20.85 kg/m     General appearance: alert, cooperative and appears stated age Neck: no adenopathy, supple, symmetrical, trachea midline and thyroid normal to inspection and palpation Breasts: normal appearance, no masses or tenderness  No cervical, supraclavicular or axillary adenopathy. She does have one small tender lymph node in the left axilla.    ASSESSMENT Night sweats, being evaluated by her primary. As part of the evaluation she had a normal TSH, FSH, LH, but elevated estradiol.   Fatigue, joint pain, nausea Left axillary tenderness (same arm she had her covid shot in back in April). The patient feels the lymph node is enlarged, feels normal sized to me, just tender.     PLAN Repeat FSH, estradiol.  Check BhcG, HIV Given information on breast tenderness. If her tenderness doesn't improve she will call. If she feels it enlarging she will call

## 2019-08-19 NOTE — Patient Instructions (Signed)

## 2019-08-20 LAB — ESTRADIOL: Estradiol: 58.5 pg/mL

## 2019-08-20 LAB — HIV ANTIBODY (ROUTINE TESTING W REFLEX): HIV Screen 4th Generation wRfx: NONREACTIVE

## 2019-08-20 LAB — BETA HCG QUANT (REF LAB): hCG Quant: <1 m[IU]/mL

## 2019-08-20 LAB — FOLLICLE STIMULATING HORMONE: FSH: 5 m[IU]/mL

## 2019-08-29 ENCOUNTER — Ambulatory Visit (INDEPENDENT_AMBULATORY_CARE_PROVIDER_SITE_OTHER): Payer: BC Managed Care – PPO | Admitting: Osteopathic Medicine

## 2019-08-29 ENCOUNTER — Encounter: Payer: Self-pay | Admitting: Osteopathic Medicine

## 2019-08-29 ENCOUNTER — Telehealth: Payer: Self-pay

## 2019-08-29 VITALS — BP 114/63 | HR 82 | Wt 116.0 lb

## 2019-08-29 DIAGNOSIS — F331 Major depressive disorder, recurrent, moderate: Secondary | ICD-10-CM

## 2019-08-29 DIAGNOSIS — R5382 Chronic fatigue, unspecified: Secondary | ICD-10-CM

## 2019-08-29 MED ORDER — BUPROPION HCL ER (XL) 150 MG PO TB24: 150.0000 mg | ORAL_TABLET | ORAL | 0 refills | Status: DC

## 2019-08-29 NOTE — Telephone Encounter (Signed)
Patient left message on voicemail  wants to make sure it is safe to take Wellbutrin and cymbaltla together. Please advise. Thanks

## 2019-08-29 NOTE — Progress Notes (Signed)
Jennifer Knight is a 24 y.o. female who presents to  [facility] at [facility]  today, 08/29/19, seeking care for the following:  . Fatigue - medical w/u negative thus far. Sleep Med consult scheduled for 09/2019. See below PHQ/GAD. Pt open to trying medications to address mental health component      ASSESSMENT & PLAN with other pertinent findings:  The primary encounter diagnosis was Chronic fatigue. A diagnosis of Moderate episode of recurrent major depressive disorder (HCC) was also pertinent to this visit.   --> start Wellbutrin 150 mg today 08/29/19, hoping to help with MDD symptoms as well as fatigue/motivation   No results found for this or any previous visit (from the past 24 hour(s)).  Depression screen [facility] 2/9 08/29/2019 08/05/2019  Decreased Interest 2 0  Down, Depressed, Hopeless 3 0  PHQ - 2 Score 5 0  Altered sleeping 3 3  Tired, decreased energy 3 3  Change in appetite 0 0  Feeling bad or failure about yourself  3 0  Trouble concentrating 3 3  Moving slowly or fidgety/restless 1 0  Suicidal thoughts 0 0  PHQ-9 Score 18 9  Difficult doing work/chores Very difficult Extremely dIfficult   GAD 7 : Generalized Anxiety Score 08/29/2019 08/05/2019  Nervous, Anxious, on Edge 1 0  Control/stop worrying 2 2  Worry too much - different things 2 2  Trouble relaxing 2 2  Restless 1 1  Easily annoyed or irritable 2 1  Afraid - awful might happen 0 0  Total GAD 7 Score 10 8  Anxiety Difficulty Very difficult Very difficult       There are no Patient Instructions on file for this visit.  No orders of the defined types were placed in this encounter.   Meds ordered this encounter  Medications  . buPROPion (WELLBUTRIN XL) 150 MG 24 hr tablet    Sig: Take 1 tablet (150 mg total) by mouth every morning.    Dispense:  90 tablet    Refill:  0       Follow-up instructions: Return in about 4 weeks (around 09/26/2019) for  VIRTUAL OR IN OFFICE - FOLLOW UP AFTER STARTING WELLBUTRIN .                                         BP 114/63 (BP Location: Left Arm, Patient Position: Sitting)   Pulse 82   Wt 116 lb (52.6 kg)   LMP 08/12/2019   SpO2 100%   BMI 21.22 kg/m   Current Meds  Medication Sig  . DULoxetine (CYMBALTA) 60 MG capsule Take 1 capsule (60 mg total) by mouth daily.  Marland Kitchen ketoconazole (NIZORAL) 2 % cream Apply 1 application topically 2 (two) times daily. To affected areas for 2-6 weeks.  Marland Kitchen levonorgestrel (MIRENA) 20 MCG/24HR IUD 1 each by Intrauterine route once.  . Multiple Vitamin (MULTIVITAMIN) tablet Take 1 tablet by mouth daily.  . naproxen sodium (ANAPROX DS) 550 MG tablet Take one tablet BID for up to 4 days if you are having prolonged bleeding    No results found for this or any previous visit (from the past 72 hour(s)).  No results found.     All questions at time of visit were answered - patient instructed to contact office with any additional concerns or updates.  ER/RTC precautions were reviewed with the patient  as applicable.   Please note: voice recognition software was used to produce this document, and typos may escape review. Please contact Dr. Lyn Hollingshead for any needed clarifications.

## 2019-08-29 NOTE — Telephone Encounter (Signed)
Yes fine to take both

## 2019-08-29 NOTE — Telephone Encounter (Signed)
Patient made aware she can take both Cymbalta, and Wellbutrin per Dr. Lyn Hollingshead.

## 2019-09-11 DIAGNOSIS — Z20822 Contact with and (suspected) exposure to covid-19: Secondary | ICD-10-CM | POA: Diagnosis not present

## 2019-09-23 DIAGNOSIS — G4719 Other hypersomnia: Secondary | ICD-10-CM | POA: Diagnosis not present

## 2019-09-26 ENCOUNTER — Telehealth (INDEPENDENT_AMBULATORY_CARE_PROVIDER_SITE_OTHER): Payer: BC Managed Care – PPO | Admitting: Osteopathic Medicine

## 2019-09-26 ENCOUNTER — Encounter: Payer: Self-pay | Admitting: Osteopathic Medicine

## 2019-09-26 VITALS — Temp 98.7°F | Ht 62.0 in | Wt 114.0 lb

## 2019-09-26 DIAGNOSIS — R5382 Chronic fatigue, unspecified: Secondary | ICD-10-CM

## 2019-09-26 DIAGNOSIS — F331 Major depressive disorder, recurrent, moderate: Secondary | ICD-10-CM | POA: Diagnosis not present

## 2019-09-26 NOTE — Progress Notes (Signed)
Virtual Visit via Video (App used: [email or web address]) Note  I connected with      Jennifer Knight on 09/26/19 at 10:31 AM  by a telemedicine application and verified that I am speaking with the correct person using two identifiers.  Patient is in separate location  I am in office   I discussed the limitations of evaluation and management by telemedicine and the availability of in person appointments. The patient expressed understanding and agreed to proceed.  History of Present Illness: Jennifer Knight is a 24 y.o. female who would like to discuss [place]   Had visit w/ sleep medicine earlier this week - was told that she might have sleep apnea though she's not meeting criteria right now, also some concern for narcolepsy issue. Planning to test for sleep apnea first then may consider formal sleep evaluation. Duloxetine: 30 then 20 then off for sleep study, per patient.   Patient states that she is feeling somewhat better on the Wellbutrin but not much.    Depression screen [facility] 2/9 09/26/2019 08/29/2019 08/05/2019  Decreased Interest 2 2 0  Down, Depressed, Hopeless 0 3 0  PHQ - 2 Score 2 5 0  Altered sleeping _0 Tired, decreased energy _1 Change in appetite 0 0 0  Feeling bad or failure about yourself  0 3 0  Trouble concentrating _2 Moving slowly or fidgety/restless 2 1 0  Suicidal thoughts 0 0 0  PHQ-9 Score _3 Difficult doing work/chores Somewhat difficult Very difficult Extremely dIfficult   GAD 7 : Generalized Anxiety Score 09/26/2019 08/29/2019 08/05/2019  Nervous, Anxious, on Edge 0 1 0  Control/stop worrying _4 Worry too much - different things _5 Trouble relaxing _6 Restless 0 1 1  Easily annoyed or irritable _7 Afraid - awful might happen 0 0 0  Total GAD 7 Score _8 Anxiety Difficulty Somewhat difficult Very difficult Very difficult      Observations/Objective: Temp 98.7 F (37.1 C)   Ht _9  (1.575 m)   Wt 114 lb (51.7  kg)   BMI 20.85 kg/m  BP Readings from Last 3 Encounters:  08/29/19 114/63  08/19/19 106/60  08/09/19 120/71   Exam: Normal Speech.  NAD  Lab and Radiology Results No results found for this or any previous visit (from the past 72 hour(s)). No results found.     Assessment and Plan: 24 y.o. female with The primary encounter diagnosis was Chronic fatigue. A diagnosis of Moderate episode of recurrent major depressive disorder (HCC) was also pertinent to this visit.  --> Will defer testing to sleep medicine for now, can try increasing the Wellbutrin, patient has plenty of the 150 mg tablets, I sent a refill for 300 mg if needed, in the meantime she can double up on the 150.  PDMP not reviewed this encounter. No orders of the defined types were placed in this encounter.  Meds ordered this encounter  Medications  . buPROPion (WELLBUTRIN XL) 150 MG 24 hr tablet    Sig: Take 2 tablets (300 mg total) by mouth every morning.    Dispense:  90 tablet    Refill:  0    Follow Up Instructions: Return for Pending evaluation by sleep medicine.    I discussed the assessment and treatment plan with the patient. The patient was provided an opportunity to ask questions  and all were answered. The patient agreed with the plan and demonstrated an understanding of the instructions.   The patient was advised to call back or seek an in-person evaluation if any new concerns, if symptoms worsen or if the condition fails to improve as anticipated.  20 minutes of non-face-to-face time was provided during this encounter.      . . . . . . . . . . . . . Marland Kitchen                   Historical information moved to improve visibility of documentation.  Past Medical History:  Diagnosis Date  . Anxiety   . Dysmenorrhea   . Migraine with aura    Past Surgical History:  Procedure Laterality Date  . HERNIA REPAIR     Social History   Tobacco Use  . Smoking status: Never  Smoker  . Smokeless tobacco: Never Used  Substance Use Topics  . Alcohol use: Yes    Alcohol/week: 3.0 - 4.0 standard drinks    Types: 3 - 4 Standard drinks or equivalent per week   family history includes Fibroids in her mother.  Medications: Current Outpatient Medications  Medication Sig Dispense Refill  . buPROPion (WELLBUTRIN XL) 150 MG 24 hr tablet Take 2 tablets (300 mg total) by mouth every morning. 90 tablet 0  . DULoxetine (CYMBALTA) 60 MG capsule Take 1 capsule (60 mg total) by mouth daily. 90 capsule 2  . ketoconazole (NIZORAL) 2 % cream Apply 1 application topically 2 (two) times daily. To affected areas for 2-6 weeks. 60 g 1  . levonorgestrel (MIRENA) 20 MCG/24HR IUD 1 each by Intrauterine route once.    . Multiple Vitamin (MULTIVITAMIN) tablet Take 1 tablet by mouth daily.    . naproxen sodium (ANAPROX DS) 550 MG tablet Take one tablet BID for up to 4 days if you are having prolonged bleeding 30 tablet 1  . ID NOW COVID-19 KIT See admin instructions. for testing     No current facility-administered medications for this visit.   No Known Allergies

## 2019-09-27 MED ORDER — BUPROPION HCL ER (XL) 150 MG PO TB24: 300.0000 mg | ORAL_TABLET | ORAL | 0 refills | Status: DC

## 2019-10-08 DIAGNOSIS — G4710 Hypersomnia, unspecified: Secondary | ICD-10-CM | POA: Diagnosis not present

## 2019-10-16 ENCOUNTER — Other Ambulatory Visit (HOSPITAL_BASED_OUTPATIENT_CLINIC_OR_DEPARTMENT_OTHER): Payer: Self-pay

## 2019-10-16 DIAGNOSIS — G4710 Hypersomnia, unspecified: Secondary | ICD-10-CM

## 2019-10-22 ENCOUNTER — Telehealth: Payer: Self-pay

## 2019-10-22 MED ORDER — BUPROPION HCL ER (XL) 300 MG PO TB24: 300.0000 mg | ORAL_TABLET | ORAL | 1 refills | Status: DC

## 2019-10-22 NOTE — Telephone Encounter (Signed)
Patient needs refill of Wellbutrin. Patient says she was told during her last visit to take two 150 mg =300 mg. Patient would like to have 300 mg sent in to pharmacy.

## 2019-10-29 DIAGNOSIS — M9901 Segmental and somatic dysfunction of cervical region: Secondary | ICD-10-CM | POA: Diagnosis not present

## 2019-10-29 DIAGNOSIS — M62830 Muscle spasm of back: Secondary | ICD-10-CM | POA: Diagnosis not present

## 2019-10-29 DIAGNOSIS — M545 Low back pain: Secondary | ICD-10-CM | POA: Diagnosis not present

## 2019-10-29 DIAGNOSIS — M9903 Segmental and somatic dysfunction of lumbar region: Secondary | ICD-10-CM | POA: Diagnosis not present

## 2019-10-31 DIAGNOSIS — M545 Low back pain: Secondary | ICD-10-CM | POA: Diagnosis not present

## 2019-10-31 DIAGNOSIS — M62830 Muscle spasm of back: Secondary | ICD-10-CM | POA: Diagnosis not present

## 2019-10-31 DIAGNOSIS — M9901 Segmental and somatic dysfunction of cervical region: Secondary | ICD-10-CM | POA: Diagnosis not present

## 2019-10-31 DIAGNOSIS — M9903 Segmental and somatic dysfunction of lumbar region: Secondary | ICD-10-CM | POA: Diagnosis not present

## 2019-11-10 ENCOUNTER — Other Ambulatory Visit: Payer: Self-pay | Admitting: Obstetrics and Gynecology

## 2019-11-11 NOTE — Telephone Encounter (Signed)
Medication refill request: Duloxetine Last AEX:  09/26/18 JJ Last OV: 08/19/19  Next AEX: none -- will call to schedule Last MMG (if hormonal medication request): n/a Refill authorized: Today, please advise

## 2019-11-18 ENCOUNTER — Ambulatory Visit (HOSPITAL_BASED_OUTPATIENT_CLINIC_OR_DEPARTMENT_OTHER): Payer: BC Managed Care – PPO | Attending: Internal Medicine | Admitting: Internal Medicine

## 2019-11-18 ENCOUNTER — Other Ambulatory Visit: Payer: Self-pay

## 2019-11-18 DIAGNOSIS — M6281 Muscle weakness (generalized): Secondary | ICD-10-CM | POA: Insufficient documentation

## 2019-11-18 DIAGNOSIS — M9903 Segmental and somatic dysfunction of lumbar region: Secondary | ICD-10-CM | POA: Diagnosis not present

## 2019-11-18 DIAGNOSIS — M545 Low back pain: Secondary | ICD-10-CM | POA: Diagnosis not present

## 2019-11-18 DIAGNOSIS — R443 Hallucinations, unspecified: Secondary | ICD-10-CM | POA: Diagnosis not present

## 2019-11-18 DIAGNOSIS — M9901 Segmental and somatic dysfunction of cervical region: Secondary | ICD-10-CM | POA: Diagnosis not present

## 2019-11-18 DIAGNOSIS — G4710 Hypersomnia, unspecified: Secondary | ICD-10-CM | POA: Insufficient documentation

## 2019-11-18 DIAGNOSIS — M62830 Muscle spasm of back: Secondary | ICD-10-CM | POA: Diagnosis not present

## 2019-11-19 ENCOUNTER — Ambulatory Visit (HOSPITAL_BASED_OUTPATIENT_CLINIC_OR_DEPARTMENT_OTHER): Payer: BC Managed Care – PPO | Attending: Internal Medicine | Admitting: Internal Medicine

## 2019-11-19 DIAGNOSIS — G4710 Hypersomnia, unspecified: Secondary | ICD-10-CM

## 2019-11-19 DIAGNOSIS — G47419 Narcolepsy without cataplexy: Secondary | ICD-10-CM | POA: Insufficient documentation

## 2019-11-20 DIAGNOSIS — M62830 Muscle spasm of back: Secondary | ICD-10-CM | POA: Diagnosis not present

## 2019-11-20 DIAGNOSIS — M9901 Segmental and somatic dysfunction of cervical region: Secondary | ICD-10-CM | POA: Diagnosis not present

## 2019-11-20 DIAGNOSIS — M9903 Segmental and somatic dysfunction of lumbar region: Secondary | ICD-10-CM | POA: Diagnosis not present

## 2019-11-20 DIAGNOSIS — M545 Low back pain: Secondary | ICD-10-CM | POA: Diagnosis not present

## 2019-11-21 DIAGNOSIS — M545 Low back pain: Secondary | ICD-10-CM | POA: Diagnosis not present

## 2019-11-21 DIAGNOSIS — M9903 Segmental and somatic dysfunction of lumbar region: Secondary | ICD-10-CM | POA: Diagnosis not present

## 2019-11-21 DIAGNOSIS — M62830 Muscle spasm of back: Secondary | ICD-10-CM | POA: Diagnosis not present

## 2019-11-21 DIAGNOSIS — M9901 Segmental and somatic dysfunction of cervical region: Secondary | ICD-10-CM | POA: Diagnosis not present

## 2019-11-25 DIAGNOSIS — M9903 Segmental and somatic dysfunction of lumbar region: Secondary | ICD-10-CM | POA: Diagnosis not present

## 2019-11-25 DIAGNOSIS — M62830 Muscle spasm of back: Secondary | ICD-10-CM | POA: Diagnosis not present

## 2019-11-25 DIAGNOSIS — G47419 Narcolepsy without cataplexy: Secondary | ICD-10-CM | POA: Diagnosis not present

## 2019-11-25 DIAGNOSIS — M9901 Segmental and somatic dysfunction of cervical region: Secondary | ICD-10-CM | POA: Diagnosis not present

## 2019-11-25 DIAGNOSIS — M545 Low back pain, unspecified: Secondary | ICD-10-CM | POA: Diagnosis not present

## 2019-11-25 DIAGNOSIS — G4710 Hypersomnia, unspecified: Secondary | ICD-10-CM | POA: Diagnosis not present

## 2019-11-25 NOTE — Procedures (Signed)
    NAME: Jennifer Knight DATE OF BIRTH:  23-May-1995 MEDICAL RECORD NUMBER [id number]  LOCATION: [facility]  PHYSICIAN: Deretha Emory  DATE OF STUDY: 11/19/2019  SLEEP STUDY TYPE: Multiple Sleep Latency Test               REFERRING PHYSICIAN: Deretha Emory, MD  INDICATION FOR STUDY: Excessive daytime sleepiness, sleep paralysis, sleep-related hallucinations, possible cataplexy  EPWORTH SLEEPINESS SCORE:  13 HEIGHT: 5\' 2"  (157.5 cm)  WEIGHT: 115 lb (52.2 kg)    Body mass index is 21.03 kg/m.  NECK SIZE: 13 in.  MEDICATIONS Patient self administered medications include: N/A. Medications administered during study include No sleep medicine administered.  SLEEP STUDY TECHNIQUE A multiple sleep latency test was performed. The channels recorded and monitored were central and occipital EEG, electrooculogram (EOG), submentalis EMG (chin), and electrocardiogram.  TECHNICAL COMMENTS Comments added by Technician: None Comments added by Scorer: N/A  IMPRESSIONS - Pathologic sleepiness was evidenced by short mean sleep latency of 15 seconds.  - 5 out of 5 sleep onset REMs present. This study suggests narcolepsy. - Total number of naps attempted: 5 . Total number of naps with sleep attained: 5  DIAGNOSIS - Narcolepsy  RECOMMENDATIONS - Return for follow up and management of narcolepsy  Sleep specialist, American Board of Internal  Medicine  ELECTRONICALLY SIGNED ON:  11/25/2019, 7:11 PM [facility] PH: [phone]   FX: [phone] ACCREDITED BY THE AMERICAN ACADEMY OF SLEEP MEDICINE

## 2019-11-25 NOTE — Procedures (Signed)
   NAME: Jennifer Knight DATE OF BIRTH:  August 17, 1995 MEDICAL RECORD NUMBER [id number]  LOCATION: [facility]  PHYSICIAN: Deretha Emory  DATE OF STUDY: 11/18/2019  SLEEP STUDY TYPE: Nocturnal Polysomnogram               REFERRING PHYSICIAN: Deretha Emory, MD  INDICATION FOR STUDY: Excessive daytime sleepiness in patient with normal HSAT. Patient tapered off of duloxetine and was off of it for one week prior to testing. Also has has sleep paralysis, sleep related hallucinations, and muscle weakness with extremes of emotion which improved when treated with SSRI   EPWORTH SLEEPINESS SCORE:  12 HEIGHT: 5\' 2"  (157.5 cm)  WEIGHT: 115 lb (52.2 kg)    Body mass index is 21.03 kg/m.  NECK SIZE: 13 in.  MEDICATIONS Patient self administered medications include: N/A. Medications administered during study include No sleep medicine administered.  SLEEP STUDY TECHNIQUE A multi-channel overnight Polysomnography study was performed. The channels recorded and monitored were central and occipital EEG, electrooculogram (EOG), submentalis EMG (chin), nasal and oral airflow, thoracic and abdominal wall motion, anterior tibialis EMG, snore microphone, electrocardiogram, and a pulse oximetry.  TECHNICAL COMMENTS Comments added by Technician: NO REST ROOM VISTED Comments added by Scorer: N/A  SLEEP ARCHITECTURE The study was initiated at 11:21:31 PM and terminated at 5:58:26 AM. The total recorded time was 396.9 minutes. EEG confirmed total sleep time was 381 minutes yielding a sleep efficiency of 96.0%%. Sleep onset after lights out was 7.2 minutes with a REM latency of 4.5 minutes. The patient spent 1.4%% of the night in stage N1 sleep, 36.6%% in stage N2 sleep, 21.1%% in stage N3 and 40.8% in REM. Wake after sleep onset (WASO) was 8.7 minutes. The Arousal Index was 5.8/hour.  RESPIRATORY PARAMETERS There were a total of 4 respiratory disturbances out of which 4 were apneas ( 0  obstructive, 0 mixed, 4 central) and 0 hypopneas. The apnea/hypopnea index (AHI) was 0.6 events/hour. The central sleep apnea index was 0.6 events/hour. The REM AHI was 0.0 events/hour and NREM AHI was 1.1 events/hour. The supine AHI was 0.5 events/hour and the non supine AHI was 0.7 events/hour. Respiratory disturbances were associated with oxygen desaturation down to a nadir of 96.0% during sleep. The mean oxygen saturation during the study was 98.2%. The cumulative time under 88% oxygen saturation was 0 minutes.  LEG MOVEMENT DATA The total leg movements were 0 with a resulting leg movement index of 0.0/hr . Associated arousal with leg movement index was 0.0/hr.  CARDIAC DATA The underlying cardiac rhythm was most consistent with sinus rhythm. Mean heart rate during sleep was 64.6 bpm. Additional rhythm abnormalities include None.  IMPRESSIONS - No Significant Obstructive Sleep apnea(OSA) - Early sleep onset REM period (4 minutes) - No significant periodic leg movements(PLMs) during sleep. However, no significant associated arousals.  DIAGNOSIS - Excessive daytime sleepiness.   RECOMMENDATIONS - May proceed with MSLT  Marland Kitchen Sleep specialist, American Board of Internal Medicine  ELECTRONICALLY SIGNED ON:  11/25/2019, 4:41 PM [facility] PH: [phone]   FX: [phone] ACCREDITED BY THE AMERICAN ACADEMY OF SLEEP MEDICINE

## 2019-11-27 DIAGNOSIS — M9901 Segmental and somatic dysfunction of cervical region: Secondary | ICD-10-CM | POA: Diagnosis not present

## 2019-11-27 DIAGNOSIS — M9903 Segmental and somatic dysfunction of lumbar region: Secondary | ICD-10-CM | POA: Diagnosis not present

## 2019-11-27 DIAGNOSIS — M545 Low back pain, unspecified: Secondary | ICD-10-CM | POA: Diagnosis not present

## 2019-11-27 DIAGNOSIS — M62830 Muscle spasm of back: Secondary | ICD-10-CM | POA: Diagnosis not present

## 2019-11-28 DIAGNOSIS — M9901 Segmental and somatic dysfunction of cervical region: Secondary | ICD-10-CM | POA: Diagnosis not present

## 2019-11-28 DIAGNOSIS — M9903 Segmental and somatic dysfunction of lumbar region: Secondary | ICD-10-CM | POA: Diagnosis not present

## 2019-11-28 DIAGNOSIS — M545 Low back pain, unspecified: Secondary | ICD-10-CM | POA: Diagnosis not present

## 2019-11-28 DIAGNOSIS — M62830 Muscle spasm of back: Secondary | ICD-10-CM | POA: Diagnosis not present

## 2019-12-02 DIAGNOSIS — M62830 Muscle spasm of back: Secondary | ICD-10-CM | POA: Diagnosis not present

## 2019-12-02 DIAGNOSIS — M9901 Segmental and somatic dysfunction of cervical region: Secondary | ICD-10-CM | POA: Diagnosis not present

## 2019-12-02 DIAGNOSIS — M9903 Segmental and somatic dysfunction of lumbar region: Secondary | ICD-10-CM | POA: Diagnosis not present

## 2019-12-02 DIAGNOSIS — M545 Low back pain, unspecified: Secondary | ICD-10-CM | POA: Diagnosis not present

## 2019-12-03 DIAGNOSIS — M9901 Segmental and somatic dysfunction of cervical region: Secondary | ICD-10-CM | POA: Diagnosis not present

## 2019-12-03 DIAGNOSIS — M9903 Segmental and somatic dysfunction of lumbar region: Secondary | ICD-10-CM | POA: Diagnosis not present

## 2019-12-03 DIAGNOSIS — M62838 Other muscle spasm: Secondary | ICD-10-CM | POA: Diagnosis not present

## 2019-12-03 DIAGNOSIS — M545 Low back pain, unspecified: Secondary | ICD-10-CM | POA: Diagnosis not present

## 2019-12-03 DIAGNOSIS — M62830 Muscle spasm of back: Secondary | ICD-10-CM | POA: Diagnosis not present

## 2019-12-04 DIAGNOSIS — G47411 Narcolepsy with cataplexy: Secondary | ICD-10-CM | POA: Diagnosis not present

## 2019-12-19 DIAGNOSIS — T50905A Adverse effect of unspecified drugs, medicaments and biological substances, initial encounter: Secondary | ICD-10-CM | POA: Diagnosis not present

## 2019-12-19 DIAGNOSIS — R519 Headache, unspecified: Secondary | ICD-10-CM | POA: Diagnosis not present

## 2019-12-19 DIAGNOSIS — H04123 Dry eye syndrome of bilateral lacrimal glands: Secondary | ICD-10-CM | POA: Diagnosis not present

## 2019-12-30 ENCOUNTER — Telehealth: Payer: Self-pay

## 2019-12-30 NOTE — Telephone Encounter (Signed)
AEX 09/2018 H/o +BV-09/2019 per wet prep  Spoke with pt. Pt states having vaginal discharge, odor and lower abd cramping after finishing last cycle. LMP 10/30. Pt denies any change to soaps or laundry detergent. Pt states uses Aveeno oatmeal bath wash and so does partner. No change in partner. Denies fever, chills, NVD or any irregular bleeding.  Pt advised to have OV for further evaluation. Pt agreeable.  Pt scheduled with Dr Oscar La on 11/9 at 10 am. Pt verbalized understanding to date and time of appt.  Encounter closed

## 2019-12-30 NOTE — Telephone Encounter (Signed)
Appointment Request From: Gaye Alken    With Provider: Romualdo Bolk, MD [facility]]    Preferred Date Range: 12/31/2019 - 01/02/2020    Preferred Times: Any Time    Reason for visit: Office Visit    Comments:  Irregular cramping and weird odor/discharge.

## 2019-12-31 ENCOUNTER — Ambulatory Visit (INDEPENDENT_AMBULATORY_CARE_PROVIDER_SITE_OTHER): Payer: BC Managed Care – PPO | Admitting: Obstetrics and Gynecology

## 2019-12-31 ENCOUNTER — Other Ambulatory Visit: Payer: Self-pay

## 2019-12-31 ENCOUNTER — Encounter: Payer: Self-pay | Admitting: Obstetrics and Gynecology

## 2019-12-31 VITALS — BP 100/60 | HR 58 | Ht 62.0 in | Wt 111.6 lb

## 2019-12-31 DIAGNOSIS — Z113 Encounter for screening for infections with a predominantly sexual mode of transmission: Secondary | ICD-10-CM

## 2019-12-31 DIAGNOSIS — B9689 Other specified bacterial agents as the cause of diseases classified elsewhere: Secondary | ICD-10-CM | POA: Diagnosis not present

## 2019-12-31 DIAGNOSIS — R102 Pelvic and perineal pain: Secondary | ICD-10-CM | POA: Diagnosis not present

## 2019-12-31 DIAGNOSIS — N760 Acute vaginitis: Secondary | ICD-10-CM | POA: Diagnosis not present

## 2019-12-31 MED ORDER — METRONIDAZOLE 0.75 % VA GEL
1.0000 | Freq: Every day | VAGINAL | 0 refills | Status: DC
Start: 2019-12-31 — End: 2020-09-30

## 2019-12-31 NOTE — Progress Notes (Signed)
GYNECOLOGY  VISIT   HPI: 24 y.o.   Married White or Caucasian Not Hispanic or Latino  female   G0P0000 with No LMP recorded. (Menstrual status: IUD).   here for  Patient states that last week she has had some cramping a discharge. She says that she has a vaginal odor.  She has recently started swinging. The other man uses protection.  She has irregular, light cycles with the IUD.   Intermittent pelvic cramping in the last week, 2-3/10 in severity. No bowel or bladder c/o. Spotting last week, none now.  She has some increase in watery vaginal d/c with an odor. Slight itching. No irritation. No dyspareunia.  She had to w/d from school this semester, diagnosed with Narcolepsy. She is on medication.   GYNECOLOGIC HISTORY: No LMP recorded. (Menstrual status: IUD). Contraception:IUD, mirena inserted in 6/20 Menopausal hormone therapy: none         OB History    Gravida  0   Para  0   Term  0   Preterm  0   AB  0   Living  0     SAB  0   TAB  0   Ectopic  0   Multiple  0   Live Births  0              Patient Active Problem List   Diagnosis Date Noted  . Migraine with aura and without status migrainosus, not intractable 07/10/2018    Past Medical History:  Diagnosis Date  . Anxiety   . Dysmenorrhea   . Migraine with aura     Past Surgical History:  Procedure Laterality Date  . HERNIA REPAIR      Current Outpatient Medications  Medication Sig Dispense Refill  . ketoconazole (NIZORAL) 2 % cream Apply 1 application topically 2 (two) times daily. To affected areas for 2-6 weeks. 60 g 1  . levonorgestrel (MIRENA) 20 MCG/24HR IUD 1 each by Intrauterine route once.    . Multiple Vitamin (MULTIVITAMIN) tablet Take 1 tablet by mouth daily.    . naproxen sodium (ANAPROX DS) 550 MG tablet Take one tablet BID for up to 4 days if you are having prolonged bleeding 30 tablet 1  . SUNOSI 75 MG TABS Take 1 tablet by mouth every morning.     No current  facility-administered medications for this visit.     ALLERGIES: Patient has no known allergies.  Family History  Problem Relation Age of Onset  . Fibroids Mother     Social History   Socioeconomic History  . Marital status: Married    Spouse name: Not on file  . Number of children: Not on file  . Years of education: Not on file  . Highest education level: Not on file  Occupational History  . Not on file  Tobacco Use  . Smoking status: Never Smoker  . Smokeless tobacco: Never Used  Vaping Use  . Vaping Use: Never used  Substance and Sexual Activity  . Alcohol use: Yes    Alcohol/week: 3.0 - 4.0 standard drinks    Types: 3 - 4 Standard drinks or equivalent per week  . Drug use: No  . Sexual activity: Yes    Birth control/protection: I.U.D.  Other Topics Concern  . Not on file  Social History Narrative  . Not on file   Social Determinants of Health   Financial Resource Strain:   . Difficulty of Paying Living Expenses: Not on file  Food  Insecurity:   . Worried About Programme researcher, broadcasting/film/video in the Last Year: Not on file  . Ran Out of Food in the Last Year: Not on file  Transportation Needs:   . Lack of Transportation (Medical): Not on file  . Lack of Transportation (Non-Medical): Not on file  Physical Activity:   . Days of Exercise per Week: Not on file  . Minutes of Exercise per Session: Not on file  Stress:   . Feeling of Stress : Not on file  Social Connections:   . Frequency of Communication with Friends and Family: Not on file  . Frequency of Social Gatherings with Friends and Family: Not on file  . Attends Religious Services: Not on file  . Active Member of Clubs or Organizations: Not on file  . Attends Banker Meetings: Not on file  . Marital Status: Not on file  Intimate Partner Violence:   . Fear of Current or Ex-Partner: Not on file  . Emotionally Abused: Not on file  . Physically Abused: Not on file  . Sexually Abused: Not on file     Review of Systems  Genitourinary:       Vaginal odor    All other systems reviewed and are negative.   PHYSICAL EXAMINATION:    BP 100/60   Pulse (!) 58   Ht 5\' 2"  (1.575 m)   Wt 111 lb 9.6 oz (50.6 kg)   SpO2 98%   BMI 20.41 kg/m     General appearance: alert, cooperative and appears stated age Abdomen: soft, non-tender; non distended, no masses,  no organomegaly  Pelvic: External genitalia:  no lesions              Urethra:  normal appearing urethra with no masses, tenderness or lesions              Bartholins and Skenes: normal                 Vagina: normal appearing vagina with normal color and discharge, no lesions              Cervix: no cervical motion tenderness, no lesions and IUD string 1.5 cm              Bimanual Exam:  Uterus:  retroverted, mobile, mildly tender, normal sized.               Adnexa: no mass, fullness, tenderness              Pelvic floor: mildly tender with initial palpation, not consistent  Chaperone was present for exam.  Wet prep: ++ clue, no trich, no wbc KOH: no yeast PH: 5   ASSESSMENT Bacterial vaginitis Pelvic cramping Mild uterine tenderness    PLAN Treat with metrogel Nuswab for std testing Take ibuprofen for cramping If cramping persist over the next week, will order a pelvic ultrasound

## 2019-12-31 NOTE — Patient Instructions (Signed)
Vaginitis Vaginitis is a condition in which the vaginal tissue swells and becomes red (inflamed). This condition is most often caused by a change in the normal balance of bacteria and yeast that live in the vagina. This change causes an overgrowth of certain bacteria or yeast, which causes the inflammation. There are different types of vaginitis, but the most common types are:  Bacterial vaginosis.  Yeast infection (candidiasis).  Trichomoniasis vaginitis. This is a sexually transmitted disease (STD).  Viral vaginitis.  Atrophic vaginitis.  Allergic vaginitis. What are the causes? The cause of this condition depends on the type of vaginitis. It can be caused by:  Bacteria (bacterial vaginosis).  Yeast, which is a fungus (yeast infection).  A parasite (trichomoniasis vaginitis).  A virus (viral vaginitis).  Low hormone levels (atrophic vaginitis). Low hormone levels can occur during pregnancy, breastfeeding, or after menopause.  Irritants, such as bubble baths, scented tampons, and feminine sprays (allergic vaginitis). Other factors can change the normal balance of the yeast and bacteria that live in the vagina. These include:  Antibiotic medicines.  Poor hygiene.  Diaphragms, vaginal sponges, spermicides, birth control pills, and intrauterine devices (IUD).  Sex.  Infection.  Uncontrolled diabetes.  A weakened defense (immune) system. What increases the risk? This condition is more likely to develop in women who:  Smoke.  Use vaginal douches, scented tampons, or scented sanitary pads.  Wear tight-fitting pants.  Wear thong underwear.  Use oral birth control pills or an IUD.  Have sex without a condom.  Have multiple sex partners.  Have an STD.  Frequently use the spermicide nonoxynol-9.  Eat lots of foods high in sugar.  Have uncontrolled diabetes.  Have low estrogen levels.  Have a weakened immune system from an immune disorder or medical  treatment.  Are pregnant or breastfeeding. What are the signs or symptoms? Symptoms vary depending on the cause of the vaginitis. Common symptoms include:  Abnormal vaginal discharge. ? The discharge is white, gray, or yellow with bacterial vaginosis. ? The discharge is thick, white, and cheesy with a yeast infection. ? The discharge is frothy and yellow or greenish with trichomoniasis.  A bad vaginal smell. The smell is fishy with bacterial vaginosis.  Vaginal itching, pain, or swelling.  Sex that is painful.  Pain or burning when urinating. Sometimes there are no symptoms. How is this diagnosed? This condition is diagnosed based on your symptoms and medical history. A physical exam, including a pelvic exam, will also be done. You may also have other tests, including:  Tests to determine the pH level (acidity or alkalinity) of your vagina.  A whiff test, to assess the odor that results when a sample of your vaginal discharge is mixed with a potassium hydroxide solution.  Tests of vaginal fluid. A sample will be examined under a microscope. How is this treated? Treatment varies depending on the type of vaginitis you have. Your treatment may include:  Antibiotic creams or pills to treat bacterial vaginosis and trichomoniasis.  Antifungal medicines, such as vaginal creams or suppositories, to treat a yeast infection.  Medicine to ease discomfort if you have viral vaginitis. Your sexual partner should also be treated.  Estrogen delivered in a cream, pill, suppository, or vaginal ring to treat atrophic vaginitis. If vaginal dryness occurs, lubricants and moisturizing creams may help. You may need to avoid scented soaps, sprays, or douches.  Stopping use of a product that is causing allergic vaginitis. Then using a vaginal cream to treat the symptoms. Follow   these instructions at home: Lifestyle  Keep your genital area clean and dry. Avoid soap, and only rinse the area with  water.  Do not douche or use tampons until your health care provider says it is okay to do so. Use sanitary pads, if needed.  Do not have sex until your health care provider approves. When you can return to sex, practice safe sex and use condoms.  Wipe from front to back. This avoids the spread of bacteria from the rectum to the vagina. General instructions  Take over-the-counter and prescription medicines only as told by your health care provider.  If you were prescribed an antibiotic medicine, take or use it as told by your health care provider. Do not stop taking or using the antibiotic even if you start to feel better.  Keep all follow-up visits as told by your health care provider. This is important. How is this prevented?  Use mild, non-scented products. Do not use things that can irritate the vagina, such as fabric softeners. Avoid the following products if they are scented: ? Feminine sprays. ? Detergents. ? Tampons. ? Feminine hygiene products. ? Soaps or bubble baths.  Let air reach your genital area. ? Wear cotton underwear to reduce moisture buildup. ? Avoid wearing underwear while you sleep. ? Avoid wearing tight pants and underwear or nylons without a cotton panel. ? Avoid wearing thong underwear.  Take off any wet clothing, such as bathing suits, as soon as possible.  Practice safe sex and use condoms. Contact a health care provider if:  You have abdominal pain.  You have a fever.  You have symptoms that last for more than 2-3 days. Get help right away if:  You have a fever and your symptoms suddenly get worse. Summary  Vaginitis is a condition in which the vaginal tissue becomes inflamed.This condition is most often caused by a change in the normal balance of bacteria and yeast that live in the vagina.  Treatment varies depending on the type of vaginitis you have.  Do not douche, use tampons , or have sex until your health care provider approves. When  you can return to sex, practice safe sex and use condoms. This information is not intended to replace advice given to you by your health care provider. Make sure you discuss any questions you have with your health care provider. Document Revised: 01/20/2017 Document Reviewed: 03/15/2016 Elsevier Patient Education  2020 Elsevier Inc.  

## 2020-01-02 LAB — CHLAMYDIA/GONOCOCCUS/TRICHOMONAS, NAA
Chlamydia by NAA: NEGATIVE
Gonococcus by NAA: NEGATIVE
Trich vag by NAA: NEGATIVE

## 2020-01-09 DIAGNOSIS — M9901 Segmental and somatic dysfunction of cervical region: Secondary | ICD-10-CM | POA: Diagnosis not present

## 2020-01-09 DIAGNOSIS — M9903 Segmental and somatic dysfunction of lumbar region: Secondary | ICD-10-CM | POA: Diagnosis not present

## 2020-01-09 DIAGNOSIS — M9902 Segmental and somatic dysfunction of thoracic region: Secondary | ICD-10-CM | POA: Diagnosis not present

## 2020-03-02 DIAGNOSIS — G47411 Narcolepsy with cataplexy: Secondary | ICD-10-CM | POA: Diagnosis not present

## 2020-05-27 DIAGNOSIS — L218 Other seborrheic dermatitis: Secondary | ICD-10-CM | POA: Diagnosis not present

## 2020-06-01 DIAGNOSIS — G47419 Narcolepsy without cataplexy: Secondary | ICD-10-CM | POA: Diagnosis not present

## 2020-06-11 ENCOUNTER — Ambulatory Visit: Payer: BC Managed Care – PPO | Admitting: Obstetrics and Gynecology

## 2020-06-15 NOTE — Progress Notes (Signed)
25 y.o. G0P0000 Married White or Caucasian Not Hispanic or Latino female here for annual exam.   She has a mirena IUD, placed in 6/20. In the last 6 months, she had a period in 12/21, 3/22, 4/22. Cycles are tolerable. No dyspareunia.  Period Duration (Days): 3-4 Period Pattern: (!) Irregular Menstrual Flow: Moderate Menstrual Control: Thin pad,Panty liner Menstrual Control Change Freq (Hours): 6 Dysmenorrhea: (!) Mild Dysmenorrhea Symptoms: Headache  No current issues with depression.   Patient's last menstrual period was 06/01/2020.          Sexually active: Yes.    The current method of family planning is IUD.    Exercising: Yes.    walking cycling barr Smoker:  no  Health Maintenance: Pap: 09/26/2018 WNL  History of abnormal Pap:  no MMG:  None  BMD:   None  Colonoscopy: none  TDaP:  utd per patient Gardasil:  Completed all 3 per patient    reports that she has never smoked. She has never used smokeless tobacco. She reports current alcohol use of about 3.0 - 4.0 standard drinks of alcohol per week. She reports that she does not use drugs. Stopped her masters program in Biology, may switch to something more plant focused.  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 Better environment for her.   Past Medical History:  Diagnosis Date  . Anxiety   . Dysmenorrhea   . Migraine with aura     Past Surgical History:  Procedure Laterality Date  . HERNIA REPAIR      Current Outpatient Medications  Medication Sig Dispense Refill  . Ascorbic Acid (VITAMIN C) 500 MG CAPS See admin instructions.    Marland Kitchen dextroamphetamine (DEXTROSTAT) 5 MG tablet Take 5 mg by mouth daily.    Marland Kitchen ketoconazole (NIZORAL) 2 % cream Apply 1 application topically 2 (two) times daily. To affected areas for 2-6 weeks. 60 g 1  . levonorgestrel (MIRENA) 20 MCG/24HR IUD 1 each by Intrauterine route once.    . metroNIDAZOLE (METROGEL) 0.75 % vaginal gel Place 1  Applicatorful vaginally at bedtime. Use nightly x 5 night 70 g 0  . Multiple Vitamin (MULTIVITAMIN) tablet Take 1 tablet by mouth daily.    . naproxen sodium (ANAPROX DS) 550 MG tablet Take one tablet BID for up to 4 days if you are having prolonged bleeding 30 tablet 1  . Probiotic Product (PROBIOTIC PO) Take by mouth.    . SUNOSI 75 MG TABS Take 1 tablet by mouth every morning.     No current facility-administered medications for this visit.    Family History  Problem Relation Age of Onset  . Fibroids Mother     Review of Systems  All other systems reviewed and are negative.   Exam:   BP 122/64   Pulse 88   Resp 14   Ht 5\' 2"  (1.575 m)   Wt 115 lb (52.2 kg)   LMP 06/01/2020   BMI 21.03 kg/m   Weight change: @WEIGHTCHANGE @ Height:   Height: 5\' 2"  (157.5 cm)  Ht Readings from Last 3 Encounters:  06/16/20 5\' 2"  (1.575 m)  12/31/19 5\' 2"  (1.575 m)  11/19/19 5\' 2"  (1.575 m)    General appearance: alert, cooperative and appears stated age Head: Normocephalic, without obvious abnormality, atraumatic Neck: no adenopathy, supple, symmetrical, trachea midline and thyroid normal to inspection and palpation Lungs: clear to auscultation bilaterally Cardiovascular: regular rate and rhythm Breasts: normal  appearance, no masses or tenderness Abdomen: soft, non-tender; non distended,  no masses,  no organomegaly Extremities: extremities normal, atraumatic, no cyanosis or edema Skin: Skin color, texture, turgor normal. No rashes or lesions Lymph nodes: Cervical, supraclavicular, and axillary nodes normal. No abnormal inguinal nodes palpated Neurologic: Grossly normal   Pelvic: External genitalia:  no lesions              Urethra:  normal appearing urethra with no masses, tenderness or lesions              Bartholins and Skenes: normal                 Vagina: normal appearing vagina with normal color and discharge, no lesions              Cervix: no lesions, IUD string 1 cm                Bimanual Exam:  Uterus:  normal size, contour, position, consistency, mobility, non-tender and retroverted              Adnexa: no mass, fullness, tenderness               Rectovaginal: Confirms               Anus:  normal sphincter tone, no lesions  Carolynn Serve chaperoned for the exam.  1. Well woman exam Discussed breast self exam Discussed calcium and vit D intake Labs with primary Pap next year  2. IUD check up Overall doing well

## 2020-06-16 ENCOUNTER — Other Ambulatory Visit: Payer: Self-pay

## 2020-06-16 ENCOUNTER — Ambulatory Visit (INDEPENDENT_AMBULATORY_CARE_PROVIDER_SITE_OTHER): Payer: BC Managed Care – PPO | Admitting: Obstetrics and Gynecology

## 2020-06-16 ENCOUNTER — Encounter: Payer: Self-pay | Admitting: Obstetrics and Gynecology

## 2020-06-16 VITALS — BP 122/64 | HR 88 | Resp 14 | Ht 62.0 in | Wt 115.0 lb

## 2020-06-16 DIAGNOSIS — B359 Dermatophytosis, unspecified: Secondary | ICD-10-CM | POA: Insufficient documentation

## 2020-06-16 DIAGNOSIS — Z01419 Encounter for gynecological examination (general) (routine) without abnormal findings: Secondary | ICD-10-CM | POA: Diagnosis not present

## 2020-06-16 DIAGNOSIS — R400 Somnolence: Secondary | ICD-10-CM | POA: Insufficient documentation

## 2020-06-16 DIAGNOSIS — Z30431 Encounter for routine checking of intrauterine contraceptive device: Secondary | ICD-10-CM | POA: Diagnosis not present

## 2020-06-16 DIAGNOSIS — Z0189 Encounter for other specified special examinations: Secondary | ICD-10-CM | POA: Insufficient documentation

## 2020-06-16 DIAGNOSIS — N920 Excessive and frequent menstruation with regular cycle: Secondary | ICD-10-CM | POA: Insufficient documentation

## 2020-06-16 DIAGNOSIS — G4710 Hypersomnia, unspecified: Secondary | ICD-10-CM | POA: Insufficient documentation

## 2020-06-16 DIAGNOSIS — N946 Dysmenorrhea, unspecified: Secondary | ICD-10-CM | POA: Insufficient documentation

## 2020-06-16 DIAGNOSIS — M25569 Pain in unspecified knee: Secondary | ICD-10-CM | POA: Insufficient documentation

## 2020-06-16 DIAGNOSIS — G47419 Narcolepsy without cataplexy: Secondary | ICD-10-CM | POA: Insufficient documentation

## 2020-06-16 NOTE — Patient Instructions (Signed)

## 2020-08-31 DIAGNOSIS — G47411 Narcolepsy with cataplexy: Secondary | ICD-10-CM | POA: Diagnosis not present

## 2020-09-30 ENCOUNTER — Other Ambulatory Visit: Payer: Self-pay

## 2020-09-30 ENCOUNTER — Ambulatory Visit (INDEPENDENT_AMBULATORY_CARE_PROVIDER_SITE_OTHER): Payer: BC Managed Care – PPO | Admitting: Obstetrics and Gynecology

## 2020-09-30 ENCOUNTER — Encounter: Payer: Self-pay | Admitting: Obstetrics and Gynecology

## 2020-09-30 VITALS — BP 130/70 | HR 80 | Ht 62.0 in | Wt 114.0 lb

## 2020-09-30 DIAGNOSIS — R102 Pelvic and perineal pain: Secondary | ICD-10-CM

## 2020-09-30 DIAGNOSIS — N923 Ovulation bleeding: Secondary | ICD-10-CM

## 2020-09-30 LAB — PREGNANCY, URINE: Preg Test, Ur: NEGATIVE

## 2020-09-30 NOTE — Progress Notes (Signed)
GYNECOLOGY  VISIT   HPI: 25 y.o.   Married White or Caucasian Hispanic or Latino  female   [id number] with Patient's last menstrual period was 09/14/2020.   here for bleeding and cramping with IUD.  She started having pelvic cramping 4 days ago, started having light bleeding. 2 days ago the cramping got severe (not typical for her) but the bleeding picked up some (still light). Today she is feeling better, still spotting.   LMP was 09/14/20, so she is mid cycle. Since her last cycle she has had postcoital spotting, was having sex daily and having daily spotting. Slight positional dyspareunia.    Mirena IUD placed 08/06/18.  At the time of her annual exam on 06/16/20 she reported: In the last 6 months, she had a period in 12/21, 3/22, 4/22. Cycles are tolerable. No dyspareunia.   GYNECOLOGIC HISTORY: Patient's last menstrual period was 09/14/2020. Contraception:IUD  Menopausal hormone therapy: none         OB History     Gravida  0   Para  0   Term  0   Preterm  0   AB  0   Living  0      SAB  0   IAB  0   Ectopic  0   Multiple  0   Live Births  0              Patient Active Problem List   Diagnosis Date Noted   Daytime somnolence 06/16/2020   Dermatophytosis 06/16/2020   Dysmenorrhea 06/16/2020   Excessive sleepiness 06/16/2020   Laboratory examination 06/16/2020   Menorrhagia 06/16/2020   Narcolepsy 06/16/2020   Pain in joint, lower leg 06/16/2020   Migraine with aura and without status migrainosus, not intractable 07/10/2018    Past Medical History:  Diagnosis Date   Anxiety    Dysmenorrhea    Migraine with aura     Past Surgical History:  Procedure Laterality Date   HERNIA REPAIR      Current Outpatient Medications  Medication Sig Dispense Refill   Ascorbic Acid (VITAMIN C) 500 MG CAPS See admin instructions.     dextroamphetamine (DEXTROSTAT) 5 MG tablet Take 5 mg by mouth daily.     ketoconazole (NIZORAL) 2 % cream Apply 1 application  topically 2 (two) times daily. To affected areas for 2-6 weeks. 60 g 1   levonorgestrel (MIRENA) 20 MCG/24HR IUD 1 each by Intrauterine route once.     Multiple Vitamin (MULTIVITAMIN) tablet Take 1 tablet by mouth daily.     naproxen sodium (ANAPROX DS) 550 MG tablet Take one tablet BID for up to 4 days if you are having prolonged bleeding 30 tablet 1   Probiotic Product (PROBIOTIC PO) Take by mouth.     SUNOSI 75 MG TABS Take 1 tablet by mouth every morning.     No current facility-administered medications for this visit.     ALLERGIES: Patient has no known allergies.  Family History  Problem Relation Age of Onset   Fibroids Mother     Social History   Socioeconomic History   Marital status: Married    Spouse name: Not on file   Number of children: Not on file   Years of education: Not on file   Highest education level: Not on file  Occupational History   Not on file  Tobacco Use   Smoking status: Never   Smokeless tobacco: Never  Vaping Use   Vaping Use: Never used  Substance and Sexual Activity   Alcohol use: Yes    Alcohol/week: 3.0 - 4.0 standard drinks    Types: 3 - 4 Standard drinks or equivalent per week   Drug use: No   Sexual activity: Yes    Birth control/protection: I.U.D.  Other Topics Concern   Not on file  Social History Narrative   Not on file   Social Determinants of Health   Financial Resource Strain: Not on file  Food Insecurity: Not on file  Transportation Needs: Not on file  Physical Activity: Not on file  Stress: Not on file  Social Connections: Not on file  Intimate Partner Violence: Not on file    Review of Systems  All other systems reviewed and are negative.  PHYSICAL EXAMINATION:    BP 130/70   Pulse 80   Ht 5\' 2"  (1.575 m)   Wt 114 lb (51.7 kg)   LMP 09/14/2020   SpO2 100%   BMI 20.85 kg/m     General appearance: alert, cooperative and appears stated age Abdomen: soft, minimally tender just left of midline in the  suprapubic region. ; non distended, no masses,  no organomegaly  Pelvic: External genitalia:  no lesions              Urethra:  normal appearing urethra with no masses, tenderness or lesions              Bartholins and Skenes: normal                 Vagina: normal appearing vagina with normal color and discharge, no lesions              Cervix: no cervical motion tenderness, no lesions, and IUD string 1-2 cm              Bimanual Exam:  Uterus:  normal size, contour, position, consistency, mobility, non-tender and retroverted              Adnexa: no mass, fullness, tenderness               Chaperone was present for exam.  1. Pelvic cramping No concerning findings on exam. Suspect she was having ovulatory pain - SURESWAB CT/NG/T. vaginalis - Pregnancy, urine  2. Intermenstrual spotting Just this cycle - SURESWAB CT/NG/T. vaginalis - Pregnancy, urine -Continue to monitor. If it persists consider ultrasound

## 2020-10-01 LAB — SURESWAB CT/NG/T. VAGINALIS
C. trachomatis RNA, TMA: NOT DETECTED
N. gonorrhoeae RNA, TMA: NOT DETECTED
Trichomonas vaginalis RNA: NOT DETECTED

## 2020-10-29 IMAGING — CT CT ABDOMEN AND PELVIS WITH CONTRAST
2 of 4 series · 16 of 46 positions shown, 18 images · IV contrast (Omni 300)
Comparison: None.

CLINICAL DATA: 22-year-old female with right side abdominal pain
and nausea.

EXAM:
CT ABDOMEN AND PELVIS WITH CONTRAST
TECHNIQUE: Multidetector CT imaging of the abdomen and pelvis was performed
using the standard protocol following bolus administration of
intravenous contrast.
CONTRAST:  100mL OMNIPAQUE IOHEXOL 300 MG/ML  SOLN

[Series 3: a/p w/ 5mm · axial · 0.66mm/px · z∈[-565,-170]mm · 13 of 87 slices shown, 15 images]
[im 4/87  soft-tissue]
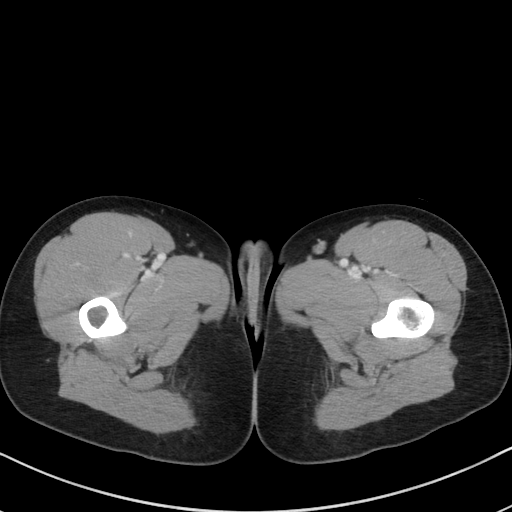
[im 4/87  bone]
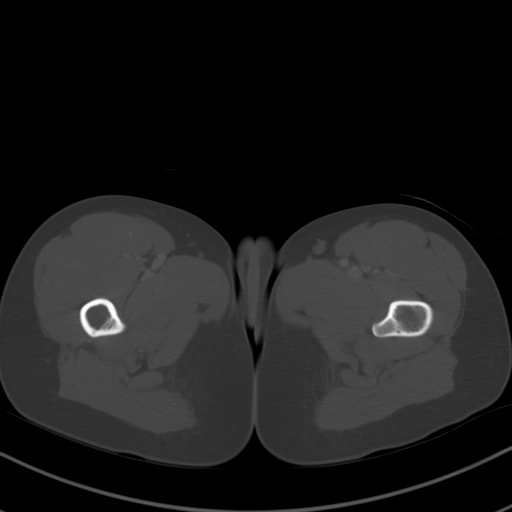
[im 10/87  soft-tissue]
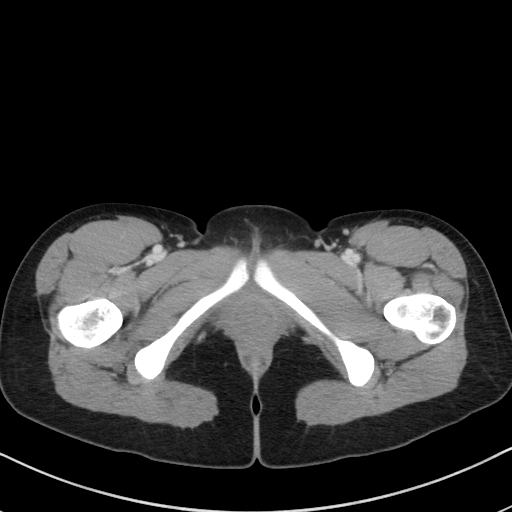
[im 17/87  soft-tissue]
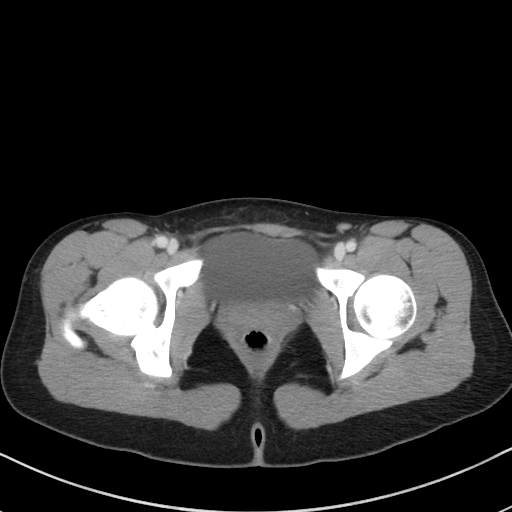
[im 24/87  soft-tissue]
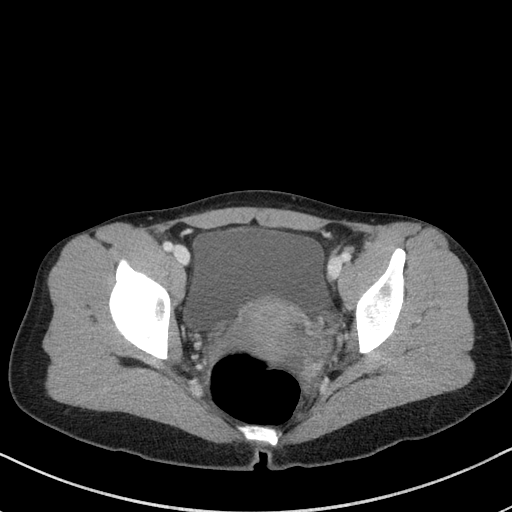
[im 30/87  soft-tissue]
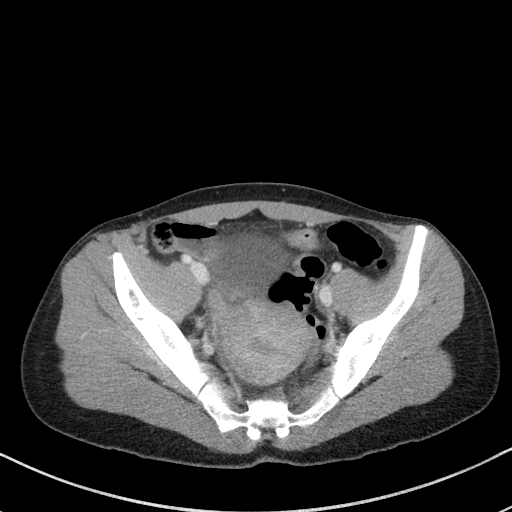
[im 37/87  soft-tissue]
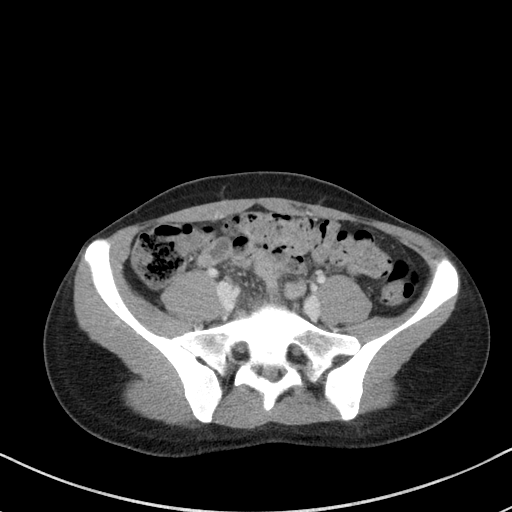
[im 44/87  soft-tissue]
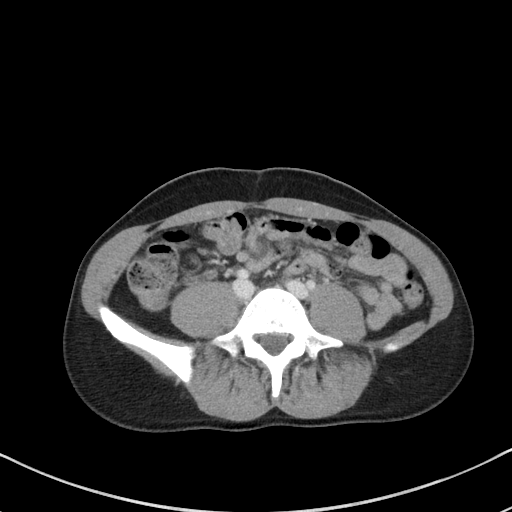
[im 50/87  soft-tissue]
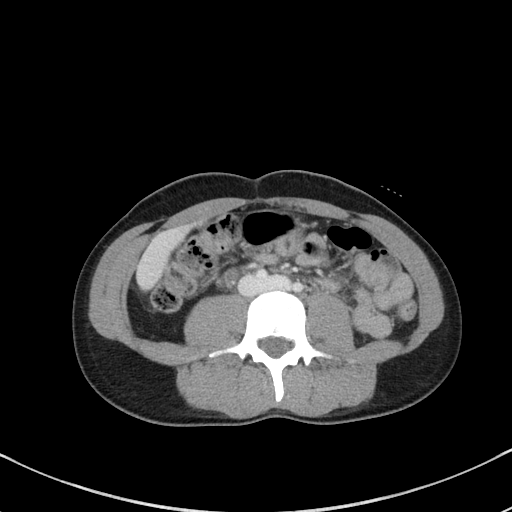
[im 57/87  soft-tissue]
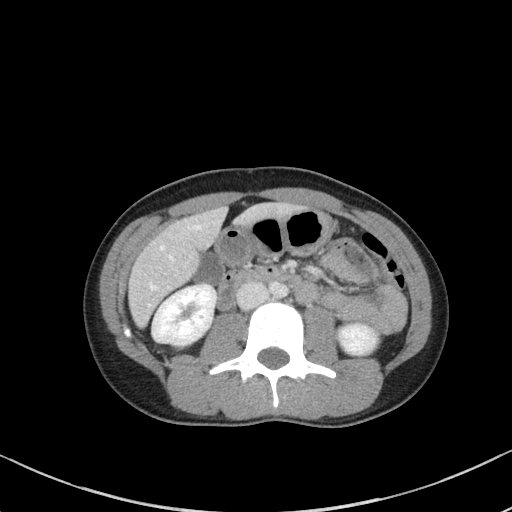
[im 57/87  bone]
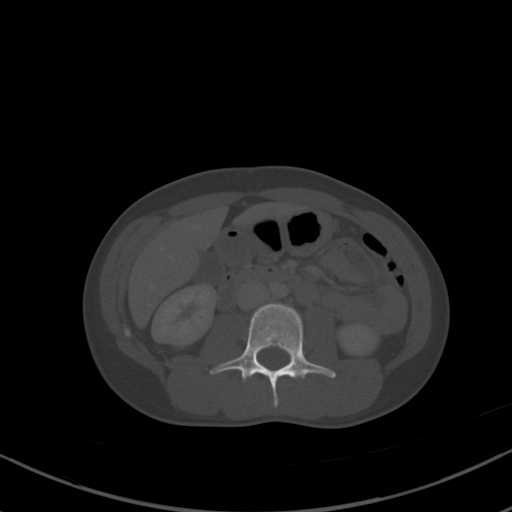
[im 63/87  soft-tissue]
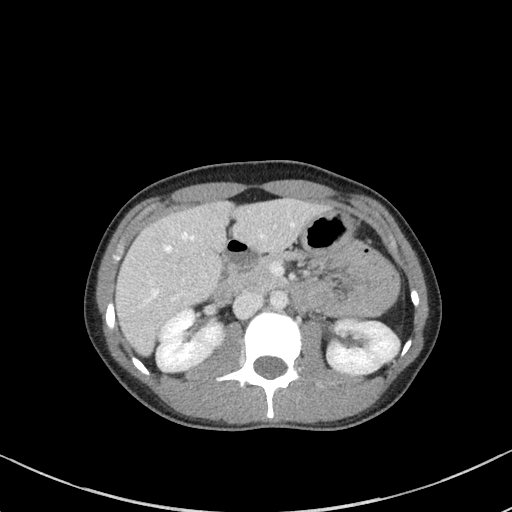
[im 70/87  soft-tissue]
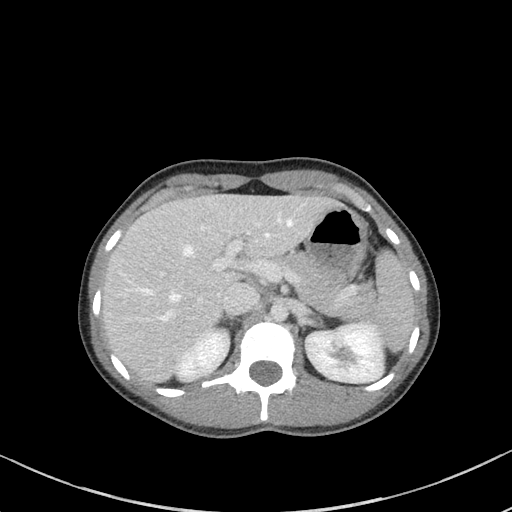
[im 77/87  soft-tissue]
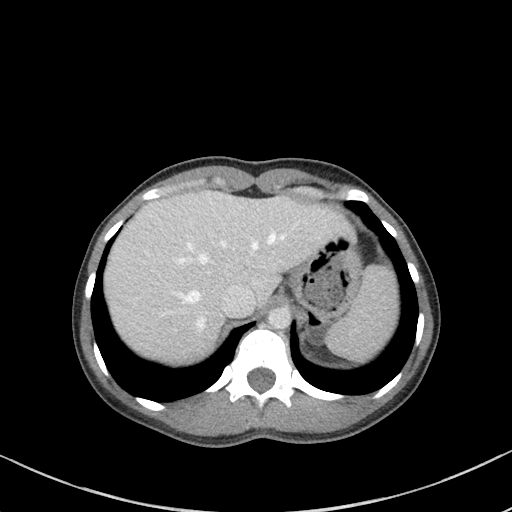
[im 83/87  soft-tissue]
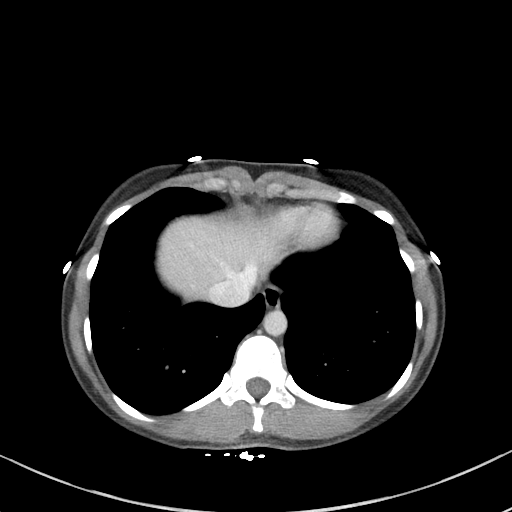

[Series 6: a/p w/ cor · coronal · 0.60mm/px · 3 of 102 slices shown]
[im 34/102  soft-tissue]
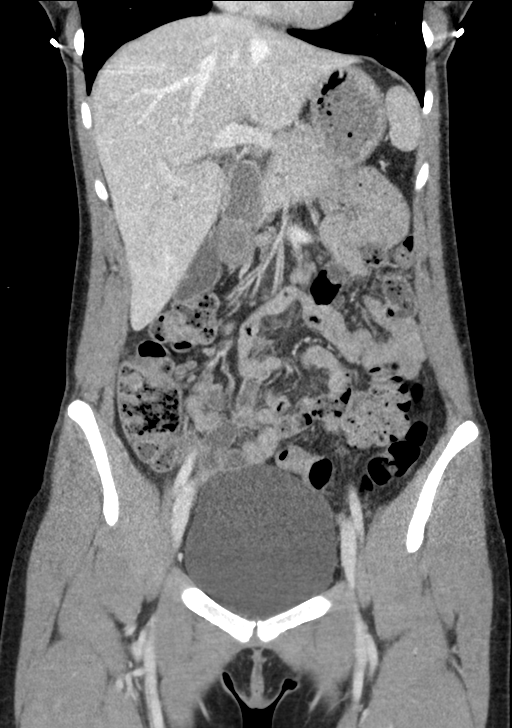
[im 45/102  soft-tissue]
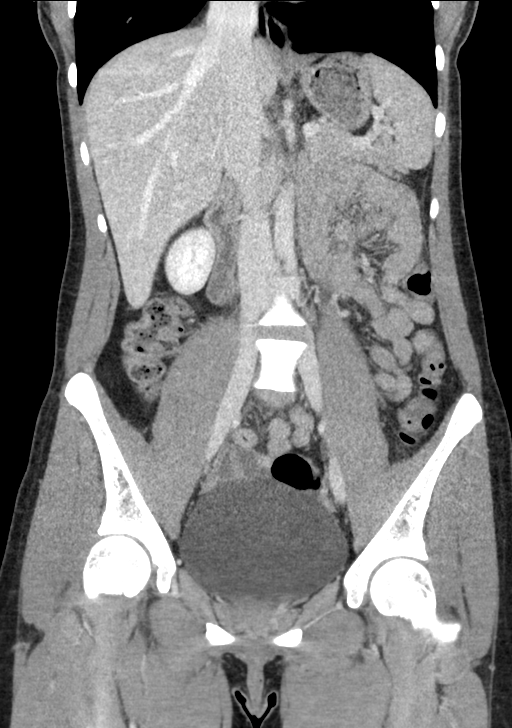
[im 57/102  soft-tissue]
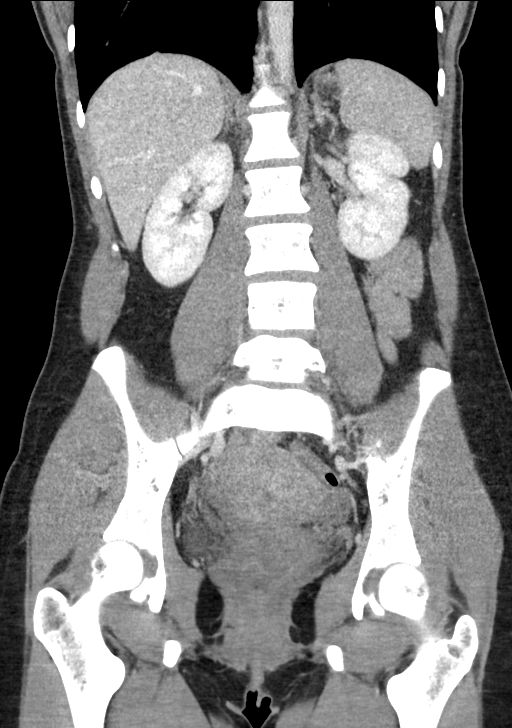

[16 of 46 positions shown; findings below may reference images not displayed]

FINDINGS: Lower chest: Negative.

Hepatobiliary: Negative liver and gallbladder.

Pancreas: Negative.

Spleen: Negative.

Adrenals/Urinary Tract: Normal adrenal glands. Bilateral renal
enhancement is symmetric and within normal limits. No perinephric
stranding. Proximal ureters seem decompressed.

Unremarkable urinary bladder.

Stomach/Bowel: Gas distended rectum. Decompressed and negative
sigmoid. Decompressed and negative descending colon. Mildly
redundant transverse colon with mild retained stool. Similar
retained stool in the right colon. Noninflamed appendix is visible
on series 3, image 44 and coronal image 37 the appendix is
nondilated. No pericecal inflammation.

Negative terminal ileum. No dilated small bowel. Probable small
juxta phrenic gastric diverticulum on series 3, image 12. Otherwise
unremarkable stomach. No mesenteric inflammation identified.

No free air. No abdominal free fluid identified.

Vascular/Lymphatic: Major vascular structures in the abdomen and
pelvis appear patent and normal.

No lymphadenopathy.

Reproductive: Negative.

Other: Trace if any pelvic free fluid.

Musculoskeletal: Negative.
IMPRESSION: Normal appendix. No acute or inflammatory process identified in the
abdomen or pelvis.

## 2021-02-23 ENCOUNTER — Encounter: Payer: Self-pay | Admitting: Obstetrics and Gynecology

## 2021-02-23 ENCOUNTER — Ambulatory Visit (INDEPENDENT_AMBULATORY_CARE_PROVIDER_SITE_OTHER): Payer: BC Managed Care – PPO | Admitting: Obstetrics and Gynecology

## 2021-02-23 ENCOUNTER — Other Ambulatory Visit: Payer: Self-pay

## 2021-02-23 VITALS — BP 120/68 | HR 76 | Ht 62.0 in | Wt 111.4 lb

## 2021-02-23 DIAGNOSIS — N762 Acute vulvitis: Secondary | ICD-10-CM | POA: Diagnosis not present

## 2021-02-23 LAB — WET PREP FOR TRICH, YEAST, CLUE

## 2021-02-23 MED ORDER — CLOBETASOL PROPIONATE 0.05 % EX OINT: TOPICAL_OINTMENT | CUTANEOUS | 0 refills | Status: AC

## 2021-02-23 NOTE — Progress Notes (Signed)
GYNECOLOGY  VISIT   HPI: 26 y.o.   Married White or Caucasian Hispanic or Latino  female   G0P0000 with Patient's last menstrual period was 02/14/2021 (approximate).   here for vulvar irritation. She had noticed for the past 2-3 weeks her left labia has been itching and dry. She has noticed some cracking of the skin. She has skin issues in general. No vaginal discharge, no odor, no dyspareunia.   GYNECOLOGIC HISTORY: Patient's last menstrual period was 02/14/2021 (approximate). Contraception:IUD, mirena, placed on 08/06/2018  Menopausal hormone therapy: none         OB History     Gravida  0   Para  0   Term  0   Preterm  0   AB  0   Living  0      SAB  0   IAB  0   Ectopic  0   Multiple  0   Live Births  0              Patient Active Problem List   Diagnosis Date Noted   Daytime somnolence 06/16/2020   Dermatophytosis 06/16/2020   Dysmenorrhea 06/16/2020   Excessive sleepiness 06/16/2020   Laboratory examination 06/16/2020   Menorrhagia 06/16/2020   Narcolepsy 06/16/2020   Pain in joint, lower leg 06/16/2020   Migraine with aura and without status migrainosus, not intractable 07/10/2018    Past Medical History:  Diagnosis Date   Anxiety    Dysmenorrhea    Migraine with aura     Past Surgical History:  Procedure Laterality Date   HERNIA REPAIR      Current Outpatient Medications  Medication Sig Dispense Refill   Ascorbic Acid (VITAMIN C) 500 MG CAPS See admin instructions.     dextroamphetamine (DEXTROSTAT) 5 MG tablet Take 5 mg by mouth daily.     ELDERBERRY PO Take by mouth.     ketoconazole (NIZORAL) 2 % cream Apply 1 application topically 2 (two) times daily. To affected areas for 2-6 weeks. 60 g 1   levonorgestrel (MIRENA) 20 MCG/24HR IUD 1 each by Intrauterine route once.     Multiple Vitamin (MULTIVITAMIN) tablet Take 1 tablet by mouth daily.     naproxen sodium (ANAPROX DS) 550 MG tablet Take one tablet BID for up to 4 days if you are  having prolonged bleeding 30 tablet 1   Probiotic Product (PROBIOTIC PO) Take by mouth.     SUNOSI 75 MG TABS Take 1 tablet by mouth every morning.     No current facility-administered medications for this visit.     ALLERGIES: Patient has no known allergies.  Family History  Problem Relation Age of Onset   Fibroids Mother     Social History   Socioeconomic History   Marital status: Married    Spouse name: Not on file   Number of children: Not on file   Years of education: Not on file   Highest education level: Not on file  Occupational History   Not on file  Tobacco Use   Smoking status: Never   Smokeless tobacco: Never  Vaping Use   Vaping Use: Never used  Substance and Sexual Activity   Alcohol use: Yes    Alcohol/week: 3.0 - 4.0 standard drinks    Types: 3 - 4 Standard drinks or equivalent per week   Drug use: No   Sexual activity: Yes    Birth control/protection: I.U.D.  Other Topics Concern   Not on file  Social History Narrative   Not on file   Social Determinants of Health   Financial Resource Strain: Not on file  Food Insecurity: Not on file  Transportation Needs: Not on file  Physical Activity: Not on file  Stress: Not on file  Social Connections: Not on file  Intimate Partner Violence: Not on file    Review of Systems  All other systems reviewed and are negative.  PHYSICAL EXAMINATION:    BP 120/68    Pulse 76    Ht 5\' 2"  (1.575 m)    Wt 111 lb 6.4 oz (50.5 kg)    LMP 02/14/2021 (Approximate)    SpO2 99%    BMI 20.38 kg/m     General appearance: alert, cooperative and appears stated age   Pelvic: External genitalia:  no lesions, some erythema and whitening on the upper vulva, above the clitoris, the erythema extends to the upper left labia majora. No agglutination, no fissures, no plaques, no lesions.               Urethra:  normal appearing urethra with no masses, tenderness or lesions              Bartholins and Skenes: normal                  Vagina: normal appearing vagina with normal color and discharge, no lesions              Cervix: no lesions and IUD string 1-2 cm               Chaperone was present for exam.  1. Acute vulvitis - WET PREP FOR TRICH, YEAST, CLUE: negative - clobetasol ointment (TEMOVATE) 0.05 %; Use a pea sized amount topically BID for up to 2 weeks as needed.  Dispense: 30 g; Refill: 0 -Vulvar skin care information given

## 2021-03-04 DIAGNOSIS — G47411 Narcolepsy with cataplexy: Secondary | ICD-10-CM | POA: Diagnosis not present

## 2021-12-24 IMAGING — US US AXILLARY LEFT
1 series · 8 of 8 positions shown · non-contrast
Comparison: None.

CLINICAL DATA: 24-year-old female presenting for evaluation of left
axillary swelling. This started following her COVID vaccination. Her
first vaccine was on [REDACTED] and the [REDACTED] in the
left arm. She states that she has also been having significant night
sweats since her vaccination.

EXAM:
ULTRASOUND OF THE LEFT AXILLA

[Series 1: us axillary left · 0.06mm/px · 8 of 8 slices shown]
[im 1/8]
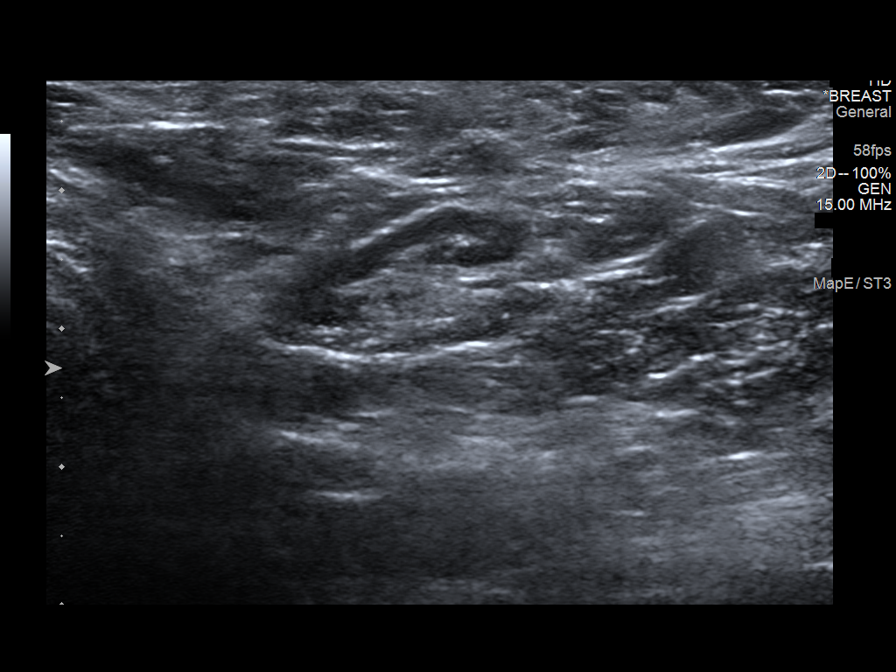
[im 2/8]
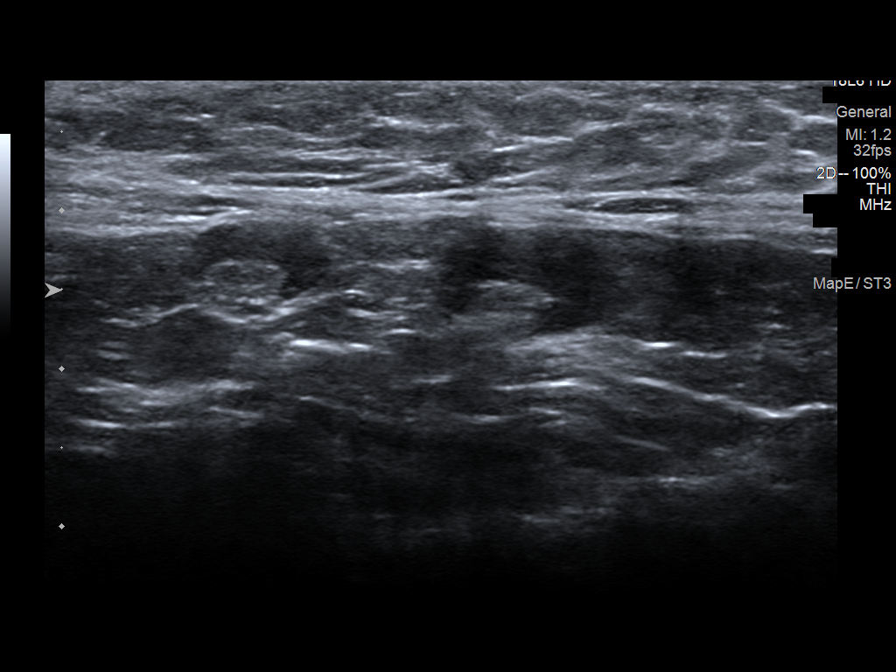
[im 3/8]
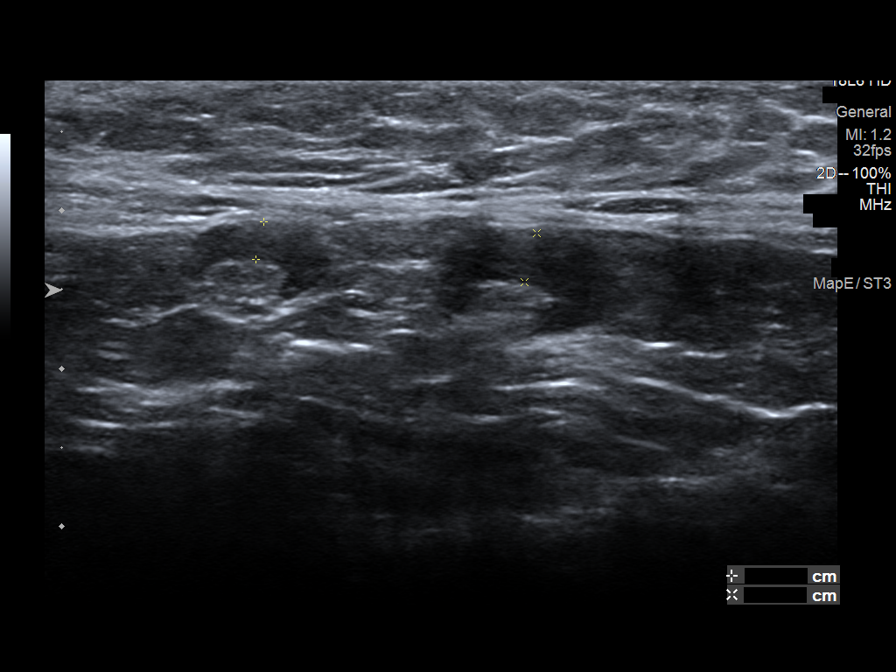
[im 4/8]
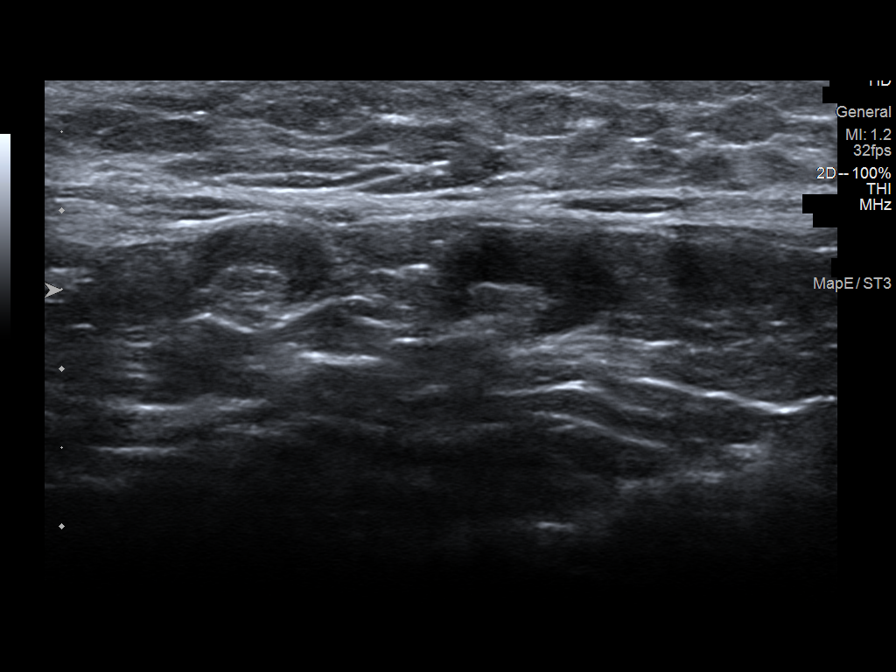
[im 5/8]
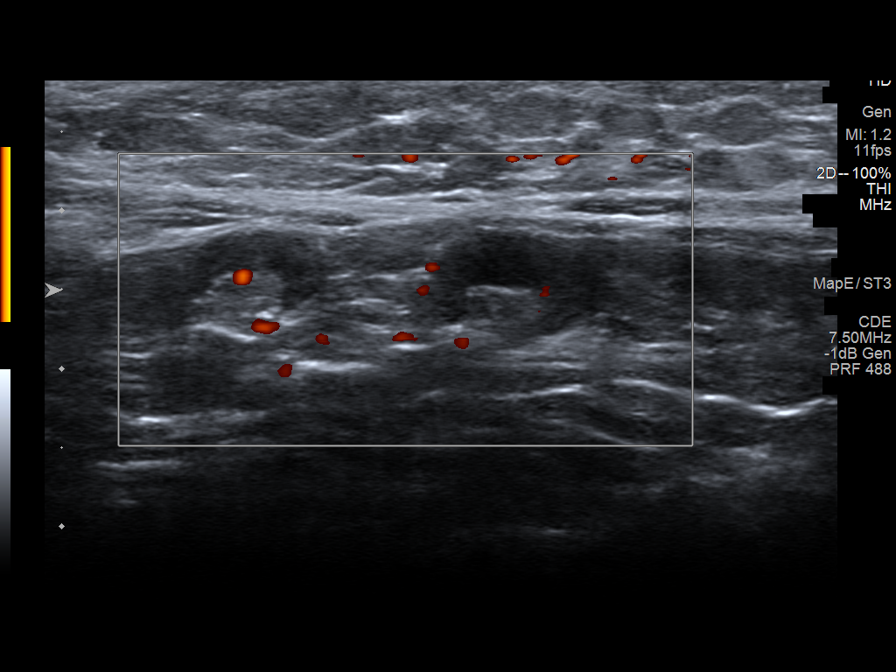
[im 6/8]
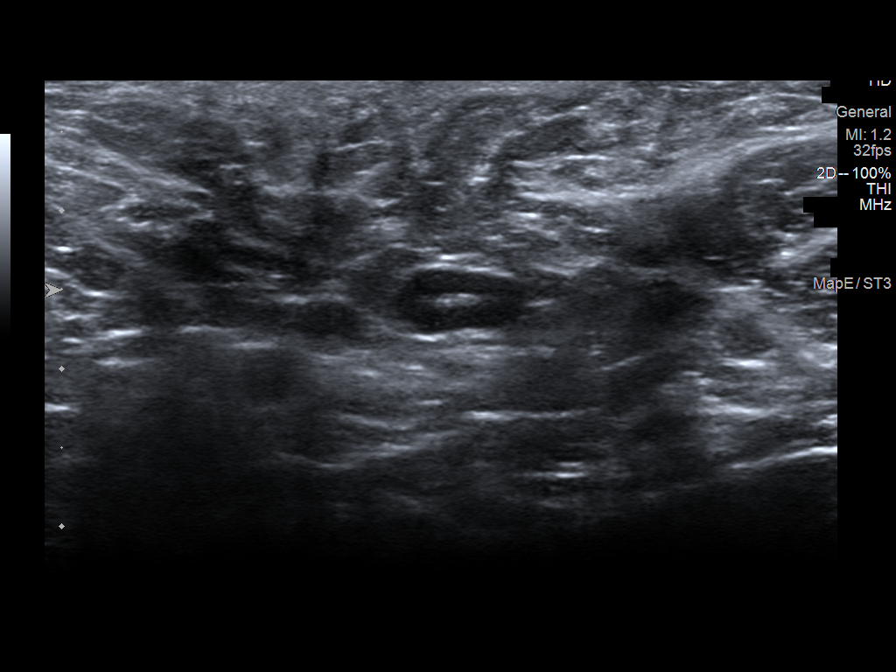
[im 7/8]
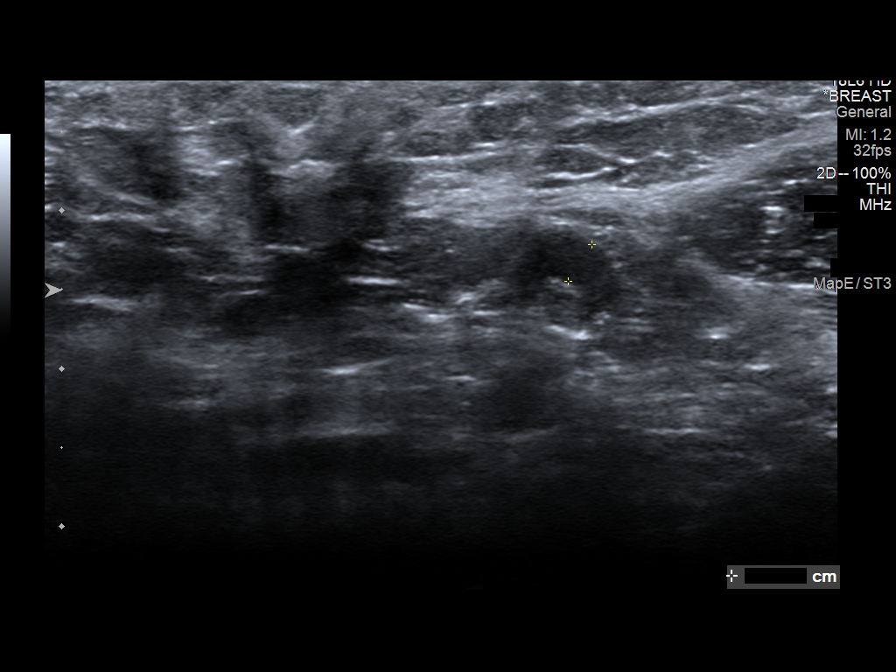
[im 8/8]
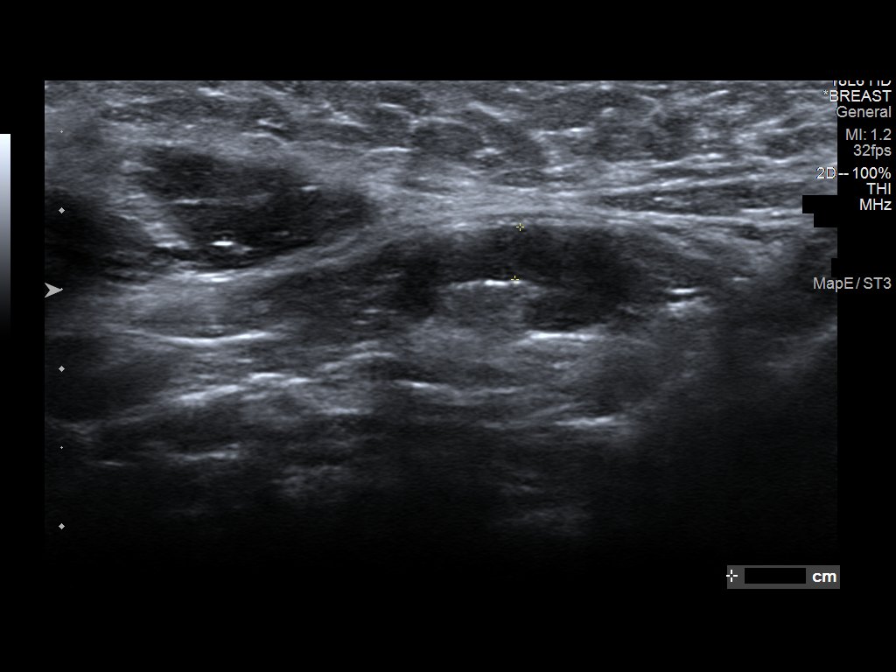

[8 of 8 positions shown; findings below may reference images not displayed]

FINDINGS: Ultrasound targeted to the left axilla demonstrates multiple lymph
nodes with cortices measuring 3 mm or less. The morphology of the
lymph nodes and the hila are preserved. No suspicious masses are
identified.
IMPRESSION: Normal left axillary lymph nodes.

RECOMMENDATION:
Clinical follow-up is recommended for the symptoms of left axillary
swelling and complaints of night sweats starting following her COVID
vaccination. While the targeted ultrasound is normal, if there is
clinical concern that the patient's symptoms are unrelated to her
COVID vaccination (ie clinical concern for lymphoma),
ultrasound-guided biopsy of one of the lymph nodes may be performed.

I have discussed the findings and recommendations with the patient.
If applicable, a reminder letter will be sent to the patient
regarding the next appointment.

BI-RADS CATEGORY  2: Benign.

## 2022-01-17 IMAGING — US US THYROID
1 series · 14 of 25 positions shown · non-contrast
Comparison: None.

CLINICAL DATA: Palpable abnormality. Fatigue. Thyromegaly on
physical examination.

EXAM:
THYROID ULTRASOUND
TECHNIQUE: Ultrasound examination of the thyroid gland and adjacent soft
tissues was performed.

[Series 1: us thyroid · 0.05mm/px · 14 of 27 slices shown]
[im 1/27]
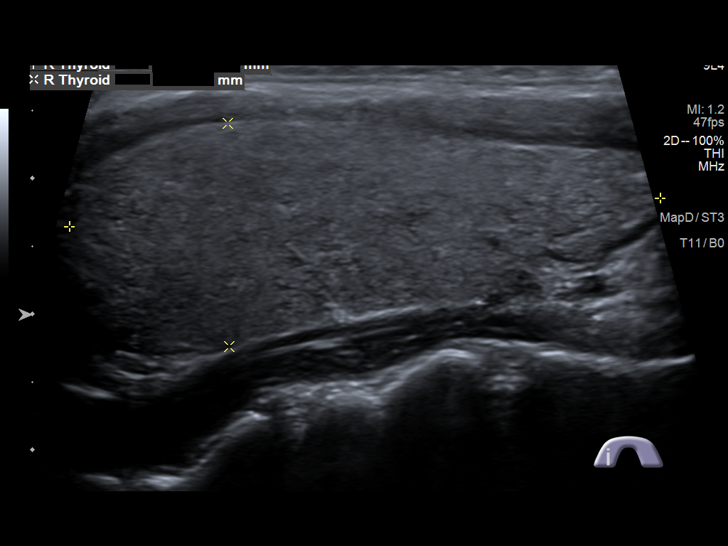
[im 3/27]
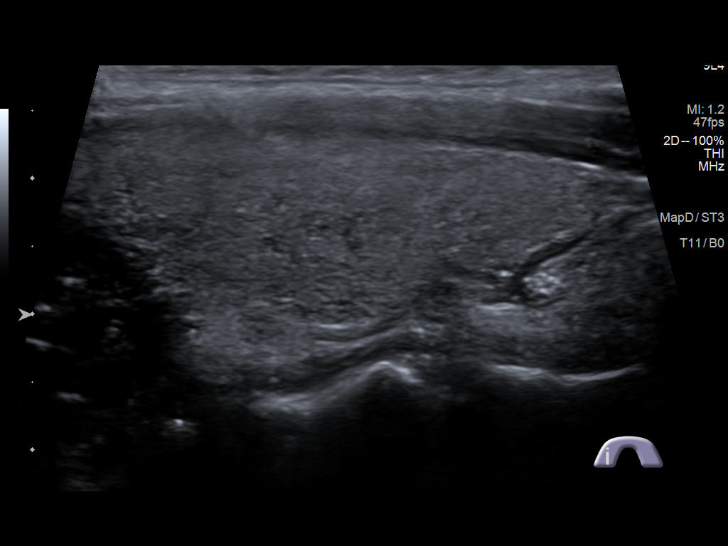
[im 5/27]
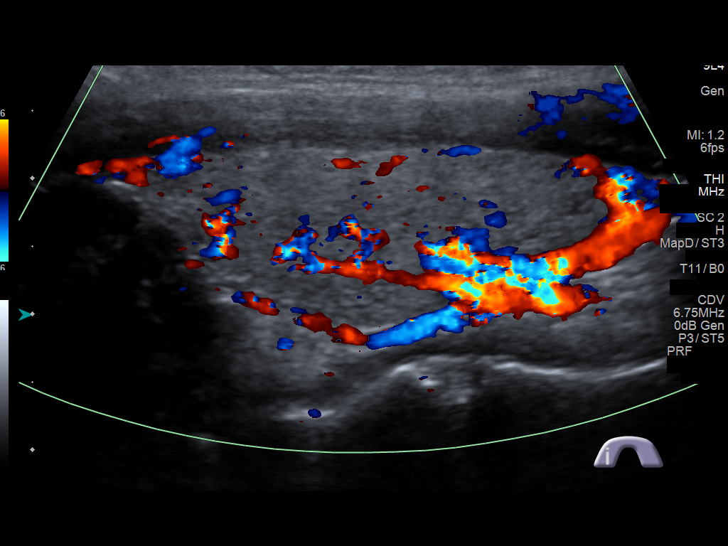
[im 7/27]
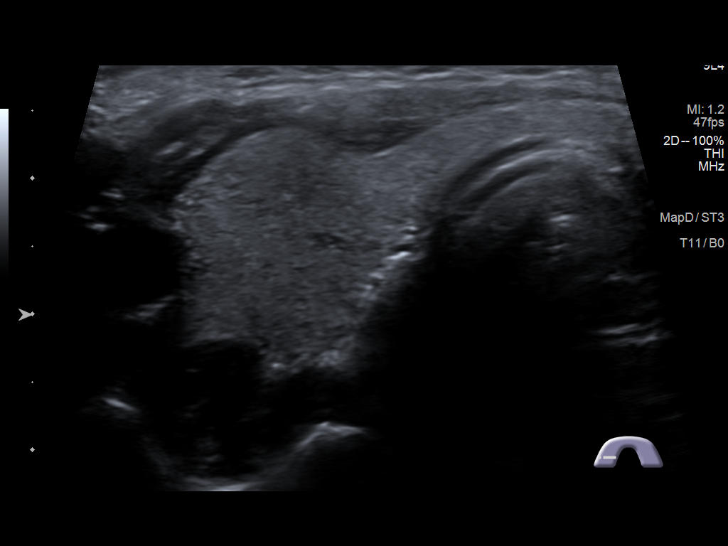
[im 9/27]
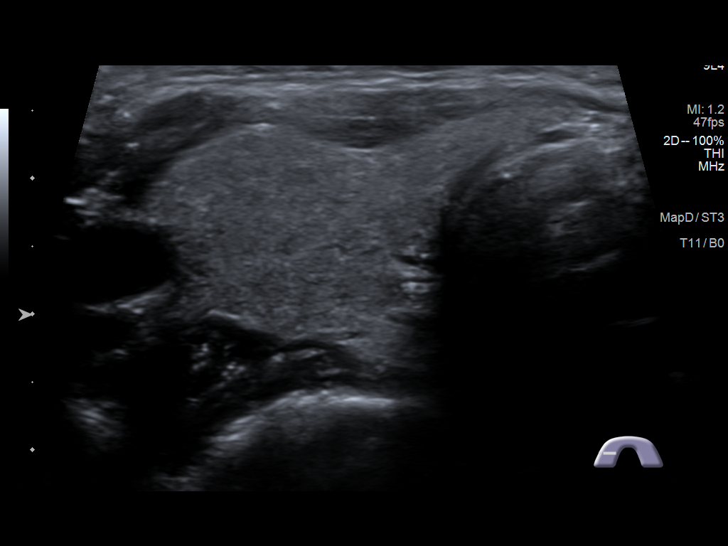
[im 10/27]
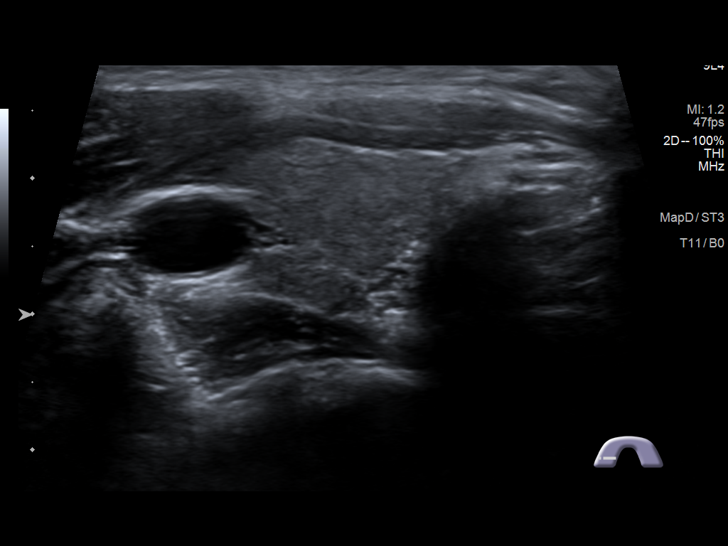
[im 12/27]
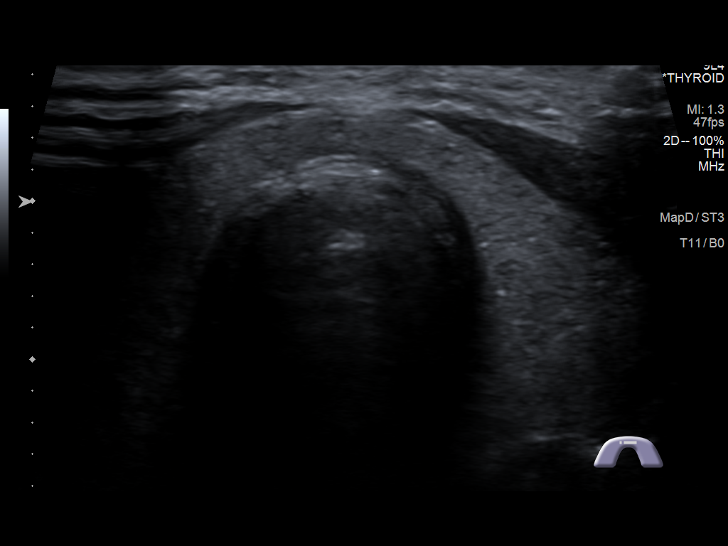
[im 15/27]
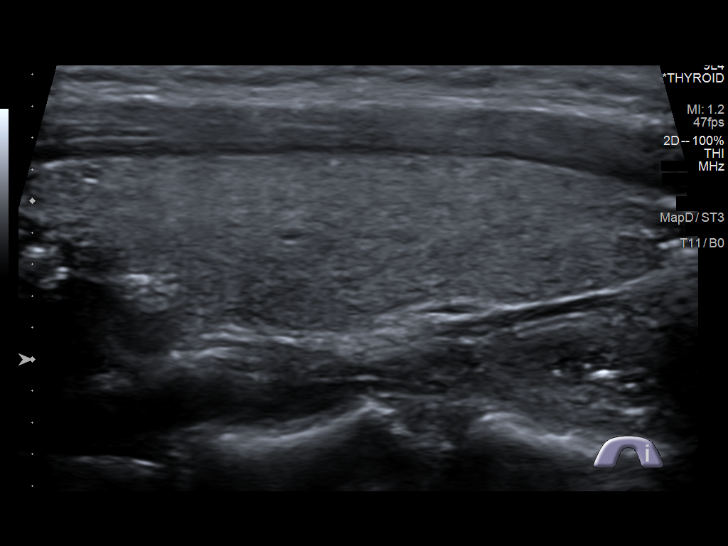
[im 17/27]
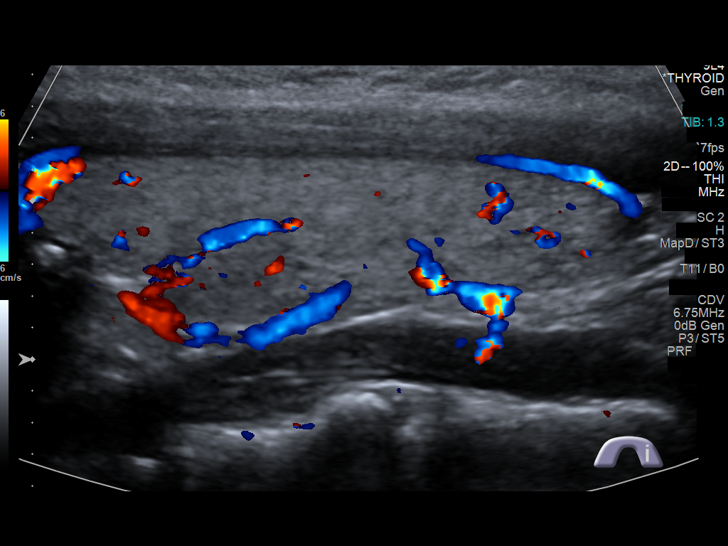
[im 18/27]
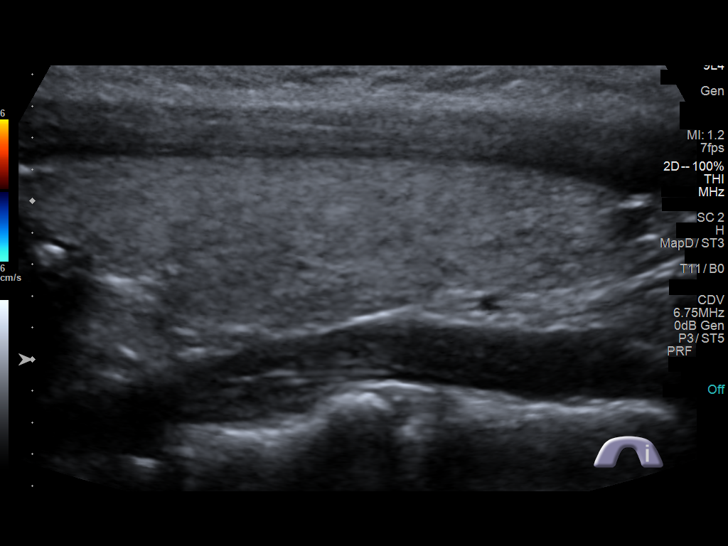
[im 20/27]
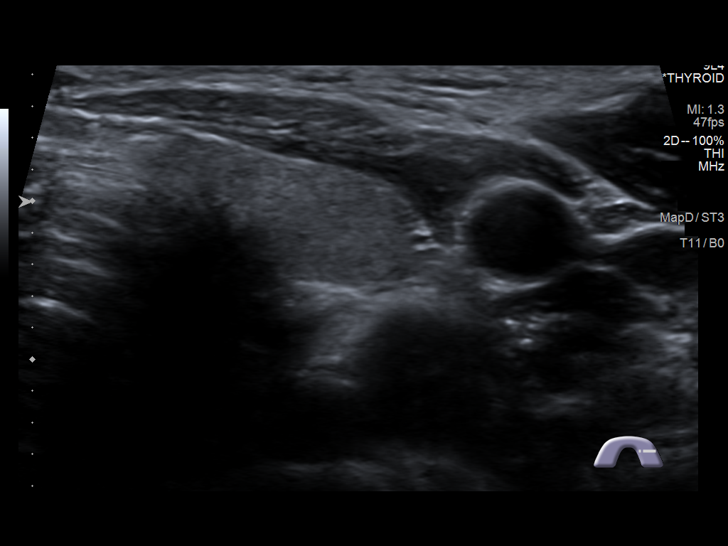
[im 22/27]
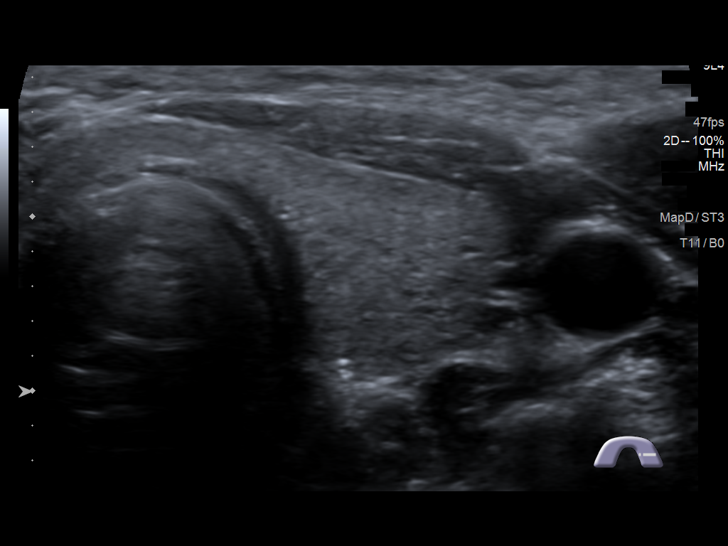
[im 24/27]
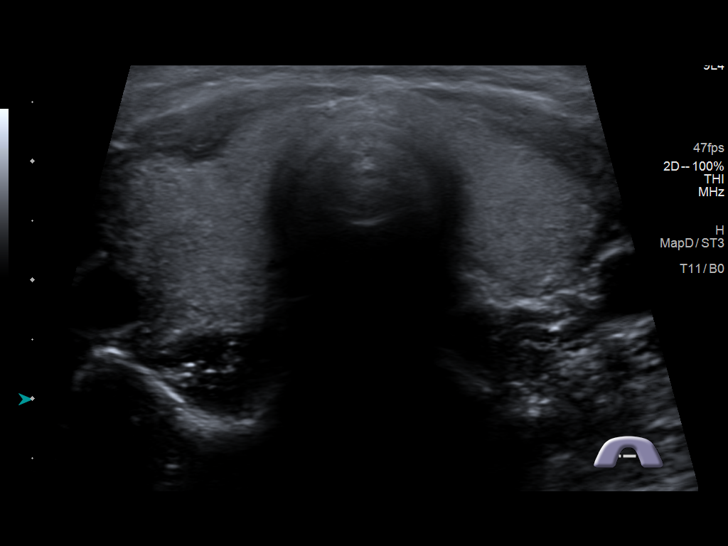
[im 27/27]
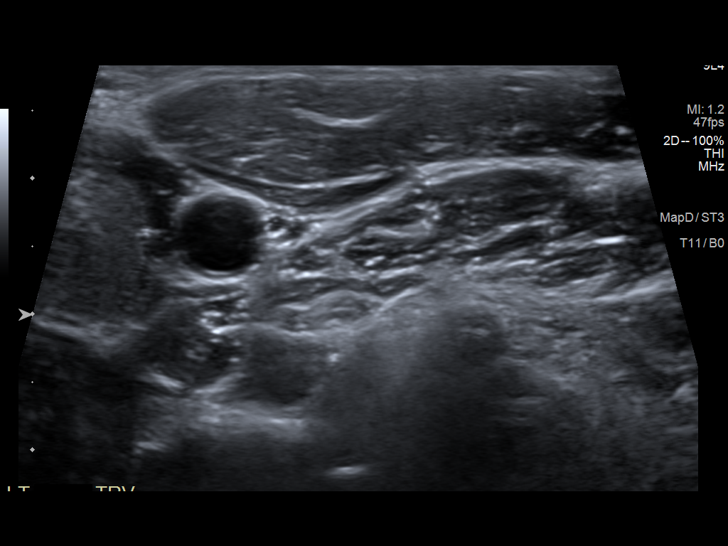

[14 of 25 positions shown; findings below may reference images not displayed]

FINDINGS: Parenchymal Echotexture: Mildly heterogenous - potential mild
glandular hyperemia (images 5, 6 and 18)

Isthmus: Normal in size measures 0.3 cm in diameter

Right lobe: Normal in size measuring 4.4 x 1.7 x 2.1 cm

Left lobe: Normal in size measuring 3.9 x 1.1 x 1.3 cm

_________________________________________________________

Estimated total number of nodules >/= 1 cm: 0

Number of spongiform nodules >/=  2 cm not described below (TR1): 0

Number of mixed cystic and solid nodules >/= 1.5 cm not described
below (TR2): 0

_________________________________________________________

No discrete nodules are seen within the thyroid gland.
IMPRESSION: Normal sized but mildly heterogeneous appearing and potentially
hyperemic thyroid gland without discrete nodule or mass. Findings
are nonspecific though could be seen in the setting of a
thyroiditis. Clinical correlation is advised.

## 2022-01-21 IMAGING — US US PELVIS COMPLETE WITH TRANSVAGINAL
1 series · 13 of 25 positions shown · non-contrast
Comparison: None

CLINICAL DATA: IUD

EXAM:
TRANSABDOMINAL AND TRANSVAGINAL ULTRASOUND OF PELVIS
TECHNIQUE: Both transabdominal and transvaginal ultrasound examinations of the
pelvis were performed. Transabdominal technique was performed for
global imaging of the pelvis including uterus, ovaries, adnexal
regions, and pelvic cul-de-sac. It was necessary to proceed with
endovaginal exam following the transabdominal exam to visualize the
uterus endometrium ovaries.

[Series 1: us pelvis complete with transvaginal · 0.21mm/px · 114 acquisitions, 13 frames shown]
[im 1/114]
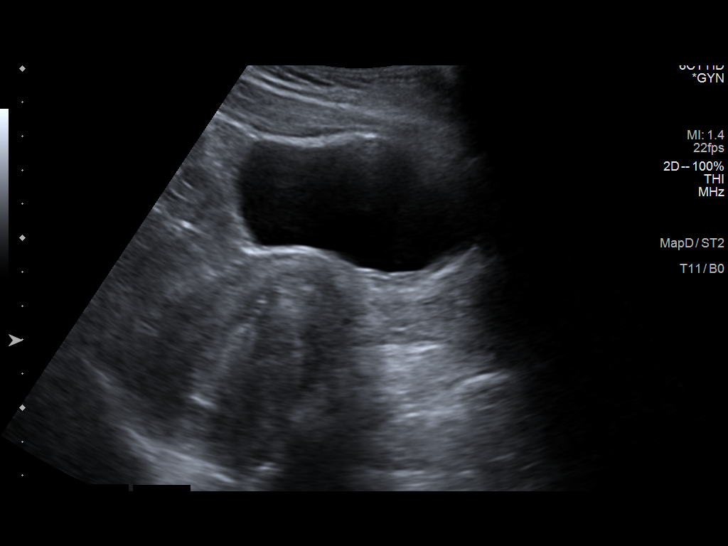
[im 10/114]
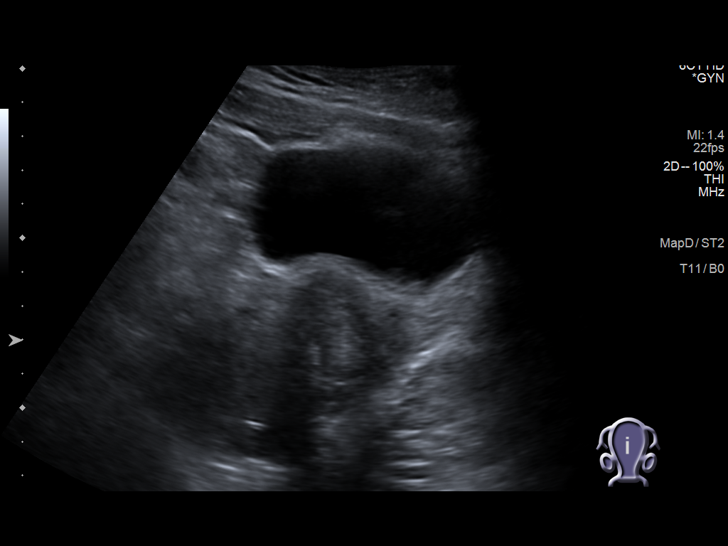
[im 19/114]
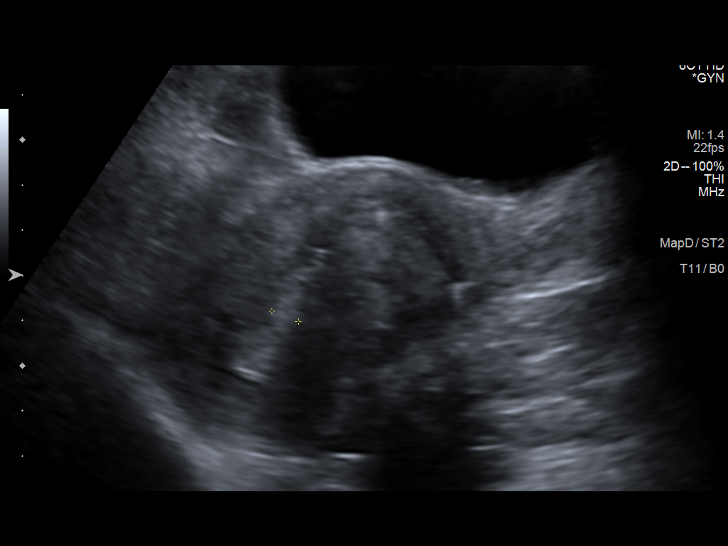
[im 29/114]
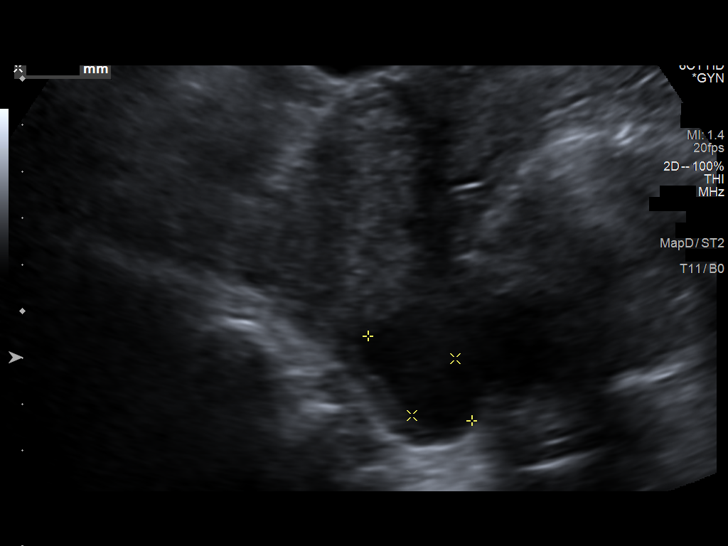
[im 38/114]
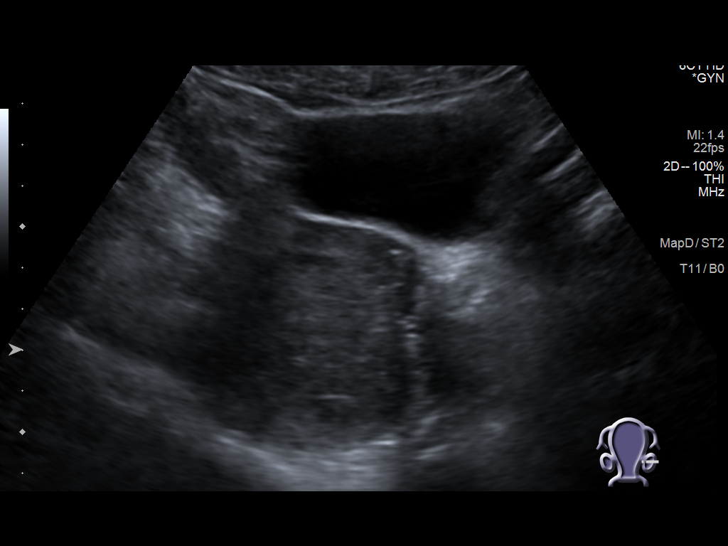
[im 48/114]
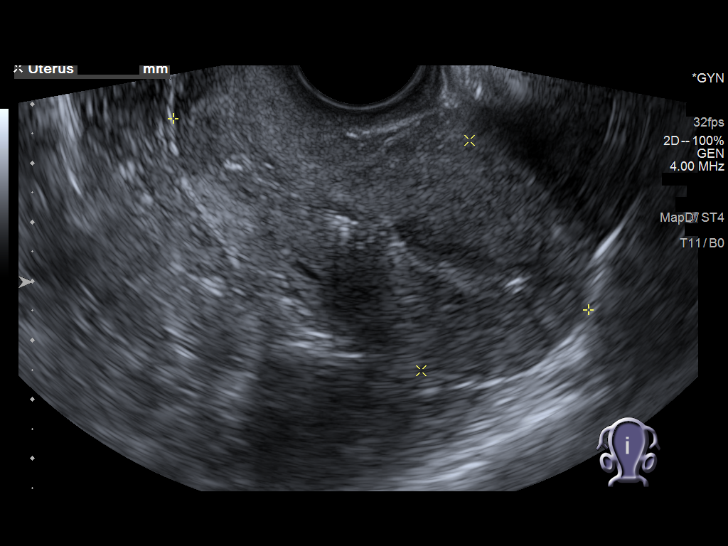
[im 57/114]
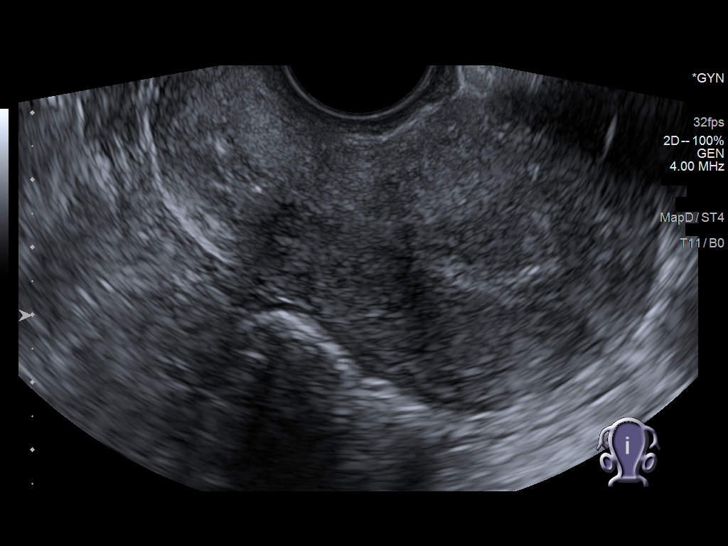
[im 66/114]
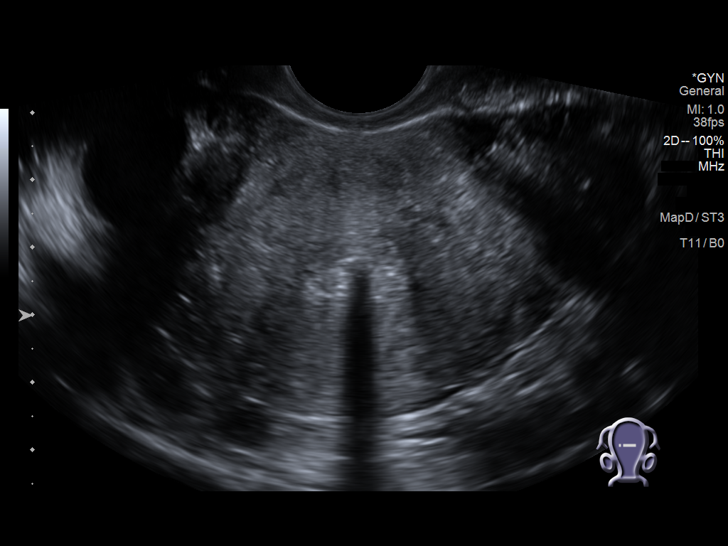
[im 76/114]
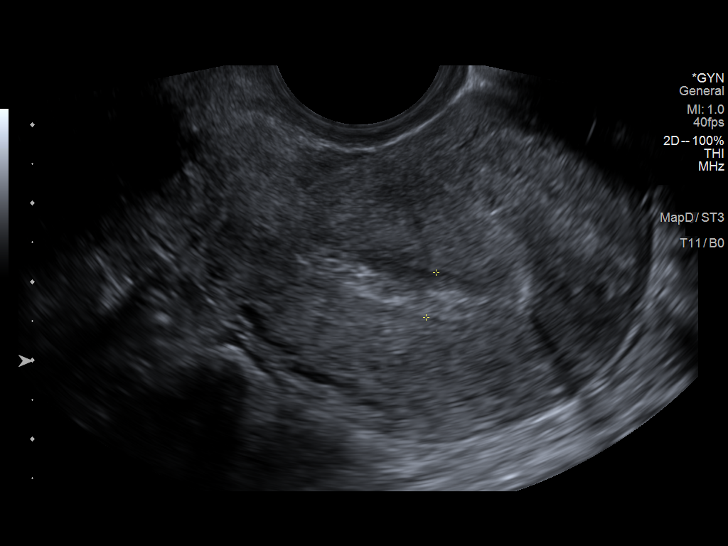
[im 85/114]
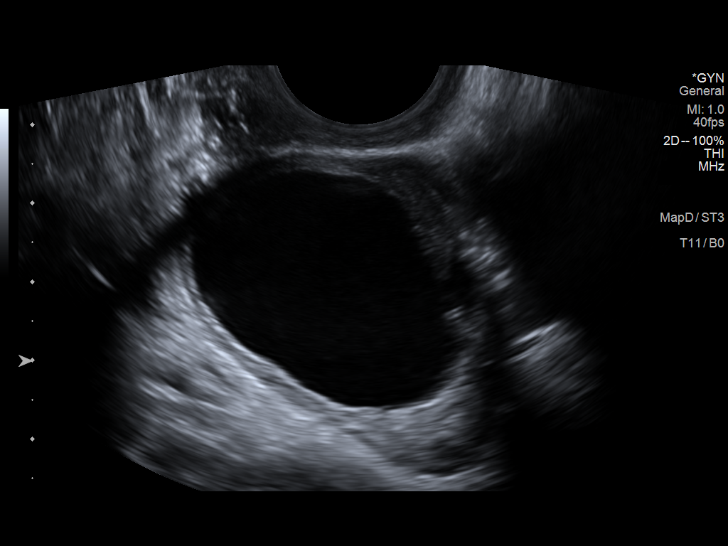
[im 95/114]
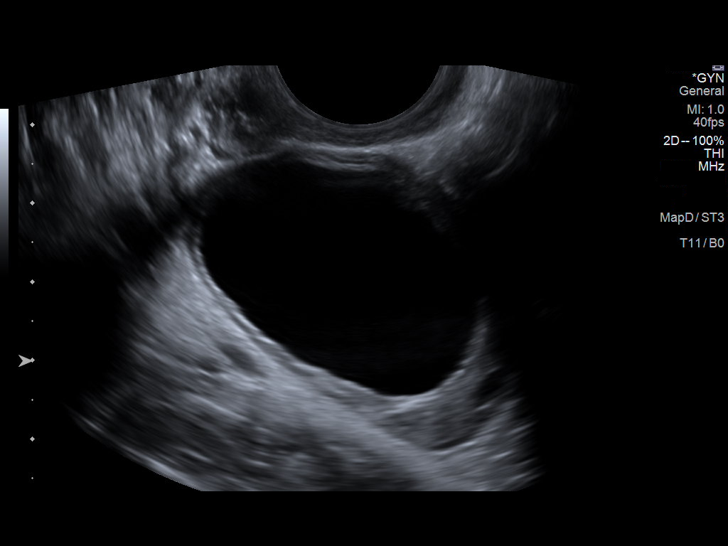
[im 104/114]
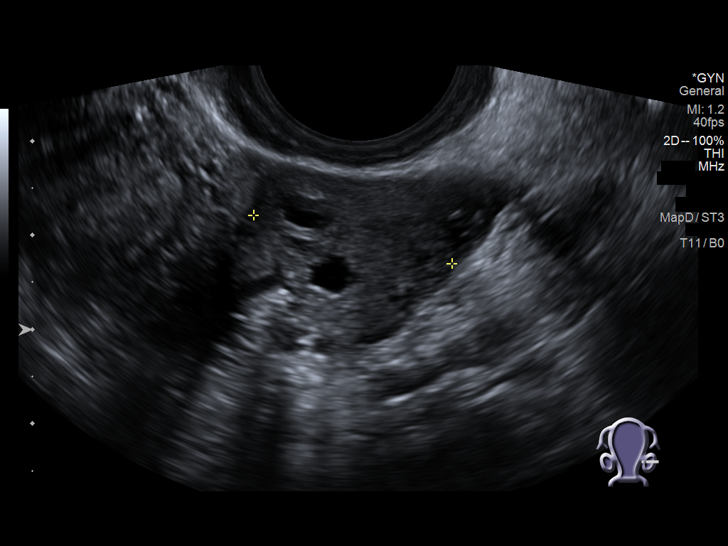
[im 114/114]
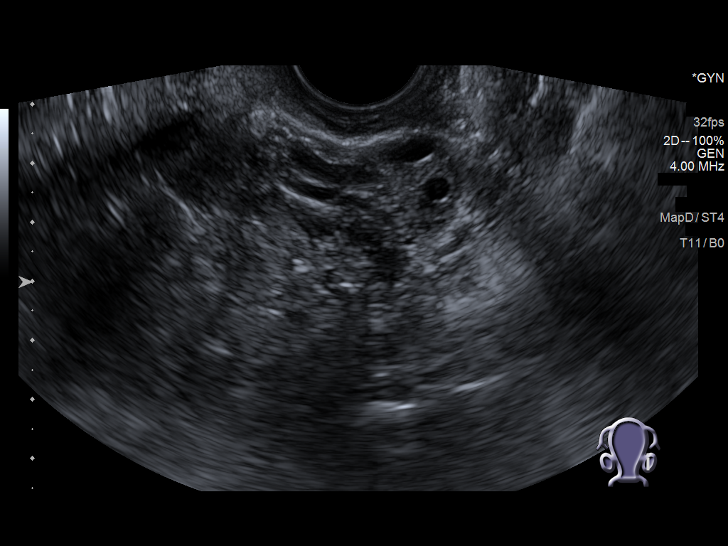

[13 of 25 positions shown; findings below may reference images not displayed]

FINDINGS: Uterus

Measurements: 7.7 x 4 x 5.7 cm = volume: 92 mL. No fibroids or other
mass visualized.

Endometrium

Thickness: 5.8 mm.  IUD within the uterus.

Right ovary

Measurements: 5.2 x 3.2 x 4.2 cm = volume: 39 mL. 4.5 x 2.6 x 3.7 cm
simple appearing cyst in the right ovary

Left ovary

Measurements: 3.3 x 2 x 2.2 cm = volume: 8 mL. Normal appearance/no
adnexal mass.

Other findings

Trace free fluid.
IMPRESSION: 1. IUD appears appropriately positioned by sonography.
2. 0.5 cm right ovarian cyst. This has benign characteristics and is
a common finding in premenopausal females. No imaging follow up is
required. This follows consensus guidelines: Simple Adnexal Cysts:
SRU Consensus Conference Update on Follow-up and Reporting.
